# Patient Record
Sex: Female | Born: 1960 | Race: White | Hispanic: No | Marital: Married | Smoking: Former smoker
Health system: Southern US, Community
[De-identification: ages and names within clinical notes are randomized; demographics above are authoritative.]

## PROBLEM LIST (undated history)

## (undated) DIAGNOSIS — R7989 Other specified abnormal findings of blood chemistry: Secondary | ICD-10-CM

## (undated) DIAGNOSIS — E785 Hyperlipidemia, unspecified: Secondary | ICD-10-CM

## (undated) DIAGNOSIS — Z9289 Personal history of other medical treatment: Secondary | ICD-10-CM

## (undated) DIAGNOSIS — M1990 Unspecified osteoarthritis, unspecified site: Secondary | ICD-10-CM

## (undated) DIAGNOSIS — D72819 Decreased white blood cell count, unspecified: Secondary | ICD-10-CM

## (undated) DIAGNOSIS — E559 Vitamin D deficiency, unspecified: Secondary | ICD-10-CM

## (undated) DIAGNOSIS — Z87442 Personal history of urinary calculi: Secondary | ICD-10-CM

## (undated) DIAGNOSIS — K5790 Diverticulosis of intestine, part unspecified, without perforation or abscess without bleeding: Secondary | ICD-10-CM

## (undated) DIAGNOSIS — M5030 Other cervical disc degeneration, unspecified cervical region: Secondary | ICD-10-CM

## (undated) DIAGNOSIS — F32A Depression, unspecified: Secondary | ICD-10-CM

## (undated) DIAGNOSIS — G35 Multiple sclerosis: Secondary | ICD-10-CM

## (undated) HISTORY — DX: Personal history of other medical treatment: Z92.89

## (undated) HISTORY — DX: Other specified abnormal findings of blood chemistry: R79.89

## (undated) HISTORY — PX: CHOLECYSTECTOMY: SHX55

## (undated) HISTORY — PX: COLONOSCOPY: SHX174

## (undated) HISTORY — DX: Multiple sclerosis: G35

## (undated) HISTORY — DX: Unspecified osteoarthritis, unspecified site: M19.90

## (undated) HISTORY — PX: APPENDECTOMY: SHX54

## (undated) HISTORY — DX: Depression, unspecified: F32.A

## (undated) HISTORY — DX: Hyperlipidemia, unspecified: E78.5

---

## 2018-11-22 ENCOUNTER — Other Ambulatory Visit: Payer: Self-pay | Admitting: Neurology

## 2018-11-22 DIAGNOSIS — G35 Multiple sclerosis: Secondary | ICD-10-CM

## 2018-12-03 ENCOUNTER — Encounter: Payer: Self-pay | Admitting: Radiology

## 2018-12-03 ENCOUNTER — Ambulatory Visit
Admission: RE | Admit: 2018-12-03 | Discharge: 2018-12-03 | Disposition: A | Discharge status: Home / Self Care | Payer: Medicare HMO | Source: Ambulatory Visit | Attending: Neurology | Admitting: Neurology

## 2018-12-03 DIAGNOSIS — G35 Multiple sclerosis: Secondary | ICD-10-CM

## 2018-12-03 MED ORDER — GADOBUTROL 1 MMOL/ML IV SOLN
9.0000 mL | Freq: Once | INTRAVENOUS | Status: AC | PRN
  Administered 2018-12-03: 9 mL via INTRAVENOUS

## 2019-03-28 ENCOUNTER — Other Ambulatory Visit: Payer: Self-pay | Admitting: Neurology

## 2019-03-28 DIAGNOSIS — G35 Multiple sclerosis: Secondary | ICD-10-CM

## 2019-04-07 ENCOUNTER — Ambulatory Visit
Admission: RE | Admit: 2019-04-07 | Discharge: 2019-04-07 | Disposition: A | Discharge status: Home / Self Care | Payer: Medicare HMO | Source: Ambulatory Visit | Attending: Neurology | Admitting: Neurology

## 2019-04-07 ENCOUNTER — Other Ambulatory Visit: Payer: Self-pay

## 2019-04-07 DIAGNOSIS — G35 Multiple sclerosis: Secondary | ICD-10-CM | POA: Diagnosis not present

## 2019-04-07 MED ORDER — GADOBUTROL 1 MMOL/ML IV SOLN
10.0000 mL | Freq: Once | INTRAVENOUS | Status: AC | PRN
  Administered 2019-04-07: 10 mL via INTRAVENOUS

## 2019-04-20 HISTORY — PX: COLONOSCOPY: SHX174

## 2019-09-06 ENCOUNTER — Ambulatory Visit: Payer: Medicare HMO | Admitting: Physical Therapy

## 2019-10-09 ENCOUNTER — Other Ambulatory Visit: Payer: Self-pay | Admitting: Neurology

## 2019-10-09 DIAGNOSIS — G35 Multiple sclerosis: Secondary | ICD-10-CM

## 2019-11-15 ENCOUNTER — Other Ambulatory Visit: Payer: Self-pay

## 2019-11-15 ENCOUNTER — Encounter: Payer: Self-pay | Admitting: Physical Therapy

## 2019-11-15 ENCOUNTER — Ambulatory Visit: Payer: Medicare HMO | Attending: Neurology | Admitting: Physical Therapy

## 2019-11-15 DIAGNOSIS — M6281 Muscle weakness (generalized): Secondary | ICD-10-CM | POA: Diagnosis present

## 2019-11-15 DIAGNOSIS — M25512 Pain in left shoulder: Secondary | ICD-10-CM | POA: Diagnosis present

## 2019-11-15 DIAGNOSIS — M25561 Pain in right knee: Secondary | ICD-10-CM | POA: Insufficient documentation

## 2019-11-15 DIAGNOSIS — R269 Unspecified abnormalities of gait and mobility: Secondary | ICD-10-CM | POA: Diagnosis present

## 2019-11-15 DIAGNOSIS — G35 Multiple sclerosis: Secondary | ICD-10-CM

## 2019-11-15 DIAGNOSIS — G8929 Other chronic pain: Secondary | ICD-10-CM | POA: Diagnosis present

## 2019-11-15 NOTE — Therapy (Signed)
[facility] [address]. [place], [place], [address] Phone: [phone]   Fax:  [phone]  Physical Therapy Evaluation  Patient Details  Name: Stefanie Cuevas MRN: [id number] Date of Birth: 07-01-1961 No data recorded  Encounter Date: 11/15/2019  PT End of Session - 11/16/19 1721    Visit Number  1    Number of Visits  1    Date for PT Re-Evaluation  11/16/19    PT Start Time  0802    PT Stop Time  0947    PT Time Calculation (min)  105 min    Activity Tolerance  Patient tolerated treatment well;Patient limited by fatigue       History reviewed. No pertinent past medical history.  History reviewed. No pertinent surgical history.  There were no vitals filed for this visit.   Subjective Assessment - 11/16/19 1720    Subjective  See FCE report    Limitations  Standing;Walking;House hold activities    Patient Stated Goals  FCE only    Currently in Pain?  Yes    Pain Score  3     Pain Location  Shoulder    Pain Orientation  Left;Anterior    Pain Descriptors / Indicators  Tingling;Numbness    Pain Type  Chronic pain    Multiple Pain Sites  Yes    Pain Score  3    Pain Location  Knee    Pain Orientation  Right    Pain Descriptors / Indicators  Aching    Pain Type  Chronic pain                 Plan - 11/16/19 1722    Clinical Impression Statement  Overall Level of Work: Falls within the Light range.  Exerting up to 20 pounds of force occasionally, and/or up to 10 pounds of force frequently, and/or a negligible amount of force constantly (Constantly: activity or condition exist 2/3 or more of the time) to move objects.  Physical demand requirements are in excess of those for Sedentary Work.  Even though the weight lifted may be only a negligible amount, a job should be rated Light Work: (1) when it requires walking or standing to significant degree; or (2) when it requires sitting most of the time but entails pushing  and/or pulling of arm or leg controls; and/or (3) when the job requires working at a production rate pace entailing the constant pushing and/or pulling of materials even though the weight of those materials is negligible.  NOTE: The constant stress and strain of maintaining a production rate pace, especially in an industrial setting, can be and is physically demanding of a worker even though the amount of force exerted is negligible.    Please see the Task Performance Table for specific abilities.  Tolerance for the 8-Hour Day: Based on the individual task scores in Dynamic Strength, Position Tolerance and Mobility, the client is able to tolerate the Light level of work for the 8-hour day/40-hour week.    Stability/Clinical Decision Making  Evolving/Moderate complexity    Clinical Decision Making  Moderate    Rehab Potential  Fair    PT Frequency  One time visit    PT Treatment/Interventions  Functional mobility training;Therapeutic exercise;Balance training;Neuromuscular re-education;Therapeutic activities;Gait training;Stair training;ADLs/Self Care Home Management    PT Next Visit Plan  FCE only       Patient will benefit from skilled therapeutic intervention in order to improve the following deficits  and impairments:  Abnormal gait, Decreased balance, Decreased mobility, Difficulty walking, Decreased range of motion, Decreased activity tolerance, Decreased strength, Impaired flexibility, Impaired UE functional use, Pain  Visit Diagnosis: Multiple sclerosis (HCC)  Muscle weakness (generalized)  Gait difficulty  Chronic left shoulder pain  Chronic pain of right knee     Problem List There are no problems to display for this patient.  Pura Spice, PT, DPT # [phone] 11/16/2019, 5:41 PM  [facility] [address] [place], [place], [address] Phone: [phone]   Fax:  [phone]  Name: Stefanie Cuevas MRN: [id number] Date of Birth:  1961-09-14

## 2019-11-16 ENCOUNTER — Encounter: Payer: Self-pay | Admitting: Physical Therapy

## 2019-12-19 ENCOUNTER — Ambulatory Visit
Admission: RE | Admit: 2019-12-19 | Discharge: 2019-12-19 | Disposition: A | Discharge status: Home / Self Care | Payer: Medicare HMO | Source: Ambulatory Visit | Attending: Neurology | Admitting: Neurology

## 2019-12-19 ENCOUNTER — Other Ambulatory Visit: Payer: Self-pay

## 2019-12-19 DIAGNOSIS — G35 Multiple sclerosis: Secondary | ICD-10-CM | POA: Diagnosis not present

## 2019-12-19 MED ORDER — GADOBUTROL 1 MMOL/ML IV SOLN
10.0000 mL | Freq: Once | INTRAVENOUS | Status: AC | PRN
  Administered 2019-12-19: 11:00:00 10 mL via INTRAVENOUS

## 2020-01-06 ENCOUNTER — Other Ambulatory Visit (HOSPITAL_COMMUNITY): Payer: Self-pay | Admitting: Internal Medicine

## 2020-01-06 DIAGNOSIS — Z1231 Encounter for screening mammogram for malignant neoplasm of breast: Secondary | ICD-10-CM

## 2020-01-08 ENCOUNTER — Other Ambulatory Visit: Payer: Self-pay

## 2020-01-08 ENCOUNTER — Ambulatory Visit (HOSPITAL_COMMUNITY)
Admission: RE | Admit: 2020-01-08 | Discharge: 2020-01-08 | Disposition: A | Discharge status: Home / Self Care | Payer: Medicare HMO | Source: Ambulatory Visit | Attending: Internal Medicine | Admitting: Internal Medicine

## 2020-01-08 DIAGNOSIS — Z1231 Encounter for screening mammogram for malignant neoplasm of breast: Secondary | ICD-10-CM | POA: Insufficient documentation

## 2020-12-09 ENCOUNTER — Other Ambulatory Visit (HOSPITAL_COMMUNITY): Payer: Self-pay | Admitting: Internal Medicine

## 2020-12-09 DIAGNOSIS — Z1231 Encounter for screening mammogram for malignant neoplasm of breast: Secondary | ICD-10-CM

## 2021-01-11 ENCOUNTER — Ambulatory Visit (HOSPITAL_COMMUNITY)
Admission: RE | Admit: 2021-01-11 | Discharge: 2021-01-11 | Disposition: A | Discharge status: Home / Self Care | Payer: Medicare HMO | Source: Ambulatory Visit | Attending: Internal Medicine | Admitting: Internal Medicine

## 2021-01-11 DIAGNOSIS — Z1231 Encounter for screening mammogram for malignant neoplasm of breast: Secondary | ICD-10-CM | POA: Diagnosis not present

## 2021-04-05 HISTORY — PX: OTHER SURGICAL HISTORY: SHX169

## 2021-07-05 ENCOUNTER — Other Ambulatory Visit (HOSPITAL_COMMUNITY): Payer: Self-pay | Admitting: Internal Medicine

## 2021-07-05 ENCOUNTER — Other Ambulatory Visit: Payer: Self-pay | Admitting: Internal Medicine

## 2021-07-05 DIAGNOSIS — M25561 Pain in right knee: Secondary | ICD-10-CM

## 2021-07-07 ENCOUNTER — Other Ambulatory Visit: Payer: Self-pay

## 2021-07-07 ENCOUNTER — Ambulatory Visit (HOSPITAL_COMMUNITY)
Admission: RE | Admit: 2021-07-07 | Discharge: 2021-07-07 | Disposition: A | Discharge status: Home / Self Care | Payer: Medicare HMO | Source: Ambulatory Visit | Attending: Internal Medicine | Admitting: Internal Medicine

## 2021-07-07 DIAGNOSIS — M25561 Pain in right knee: Secondary | ICD-10-CM | POA: Diagnosis not present

## 2021-11-29 DIAGNOSIS — R569 Unspecified convulsions: Secondary | ICD-10-CM

## 2021-11-29 HISTORY — DX: Unspecified convulsions: R56.9

## 2021-12-09 ENCOUNTER — Other Ambulatory Visit: Payer: Self-pay | Admitting: Neurology

## 2021-12-09 DIAGNOSIS — G35 Multiple sclerosis: Secondary | ICD-10-CM

## 2021-12-20 ENCOUNTER — Ambulatory Visit
Admission: RE | Admit: 2021-12-20 | Discharge: 2021-12-20 | Disposition: A | Discharge status: Home / Self Care | Payer: Medicare HMO | Source: Ambulatory Visit | Attending: Neurology | Admitting: Neurology

## 2021-12-20 ENCOUNTER — Other Ambulatory Visit: Payer: Self-pay

## 2021-12-20 DIAGNOSIS — G35 Multiple sclerosis: Secondary | ICD-10-CM | POA: Diagnosis present

## 2021-12-20 MED ORDER — GADOBUTROL 1 MMOL/ML IV SOLN
6.0000 mL | Freq: Once | INTRAVENOUS | Status: AC | PRN
  Administered 2021-12-20: 6 mL via INTRAVENOUS

## 2022-03-02 ENCOUNTER — Other Ambulatory Visit (HOSPITAL_COMMUNITY): Payer: Self-pay | Admitting: Internal Medicine

## 2022-03-02 DIAGNOSIS — Z1231 Encounter for screening mammogram for malignant neoplasm of breast: Secondary | ICD-10-CM

## 2022-03-15 ENCOUNTER — Other Ambulatory Visit (HOSPITAL_COMMUNITY): Payer: Self-pay | Admitting: Internal Medicine

## 2022-03-15 ENCOUNTER — Other Ambulatory Visit: Payer: Self-pay | Admitting: Internal Medicine

## 2022-03-15 DIAGNOSIS — R7989 Other specified abnormal findings of blood chemistry: Secondary | ICD-10-CM

## 2022-03-16 ENCOUNTER — Ambulatory Visit (HOSPITAL_COMMUNITY)
Admission: RE | Admit: 2022-03-16 | Discharge: 2022-03-16 | Disposition: A | Discharge status: Home / Self Care | Payer: Medicare HMO | Source: Ambulatory Visit | Attending: Internal Medicine | Admitting: Internal Medicine

## 2022-03-16 DIAGNOSIS — Z1231 Encounter for screening mammogram for malignant neoplasm of breast: Secondary | ICD-10-CM | POA: Diagnosis present

## 2022-03-16 DIAGNOSIS — R921 Mammographic calcification found on diagnostic imaging of breast: Secondary | ICD-10-CM | POA: Insufficient documentation

## 2022-03-18 ENCOUNTER — Other Ambulatory Visit (HOSPITAL_COMMUNITY): Payer: Self-pay | Admitting: Internal Medicine

## 2022-03-22 ENCOUNTER — Other Ambulatory Visit (HOSPITAL_COMMUNITY): Payer: Self-pay | Admitting: Internal Medicine

## 2022-03-22 DIAGNOSIS — R928 Other abnormal and inconclusive findings on diagnostic imaging of breast: Secondary | ICD-10-CM

## 2022-03-28 ENCOUNTER — Ambulatory Visit (HOSPITAL_COMMUNITY)
Admission: RE | Admit: 2022-03-28 | Discharge: 2022-03-28 | Disposition: A | Discharge status: Home / Self Care | Payer: Medicare HMO | Source: Ambulatory Visit | Attending: Internal Medicine | Admitting: Internal Medicine

## 2022-03-28 DIAGNOSIS — R7989 Other specified abnormal findings of blood chemistry: Secondary | ICD-10-CM | POA: Diagnosis present

## 2022-03-30 ENCOUNTER — Ambulatory Visit (HOSPITAL_COMMUNITY)
Admission: RE | Admit: 2022-03-30 | Discharge: 2022-03-30 | Disposition: A | Discharge status: Home / Self Care | Payer: Medicare HMO | Source: Ambulatory Visit | Attending: Internal Medicine | Admitting: Internal Medicine

## 2022-03-30 ENCOUNTER — Encounter (HOSPITAL_COMMUNITY): Payer: Self-pay

## 2022-03-30 DIAGNOSIS — R928 Other abnormal and inconclusive findings on diagnostic imaging of breast: Secondary | ICD-10-CM

## 2022-05-26 ENCOUNTER — Other Ambulatory Visit: Payer: Self-pay | Admitting: Student

## 2022-05-26 DIAGNOSIS — R202 Paresthesia of skin: Secondary | ICD-10-CM

## 2022-05-27 ENCOUNTER — Other Ambulatory Visit (HOSPITAL_COMMUNITY): Payer: Self-pay | Admitting: Student

## 2022-05-27 DIAGNOSIS — R202 Paresthesia of skin: Secondary | ICD-10-CM

## 2022-06-10 ENCOUNTER — Ambulatory Visit (HOSPITAL_COMMUNITY)
Admission: RE | Admit: 2022-06-10 | Discharge: 2022-06-10 | Disposition: A | Discharge status: Home / Self Care | Payer: Medicare HMO | Source: Ambulatory Visit | Attending: Student | Admitting: Student

## 2022-06-10 DIAGNOSIS — R202 Paresthesia of skin: Secondary | ICD-10-CM | POA: Diagnosis present

## 2022-06-10 MED ORDER — GADOBUTROL 1 MMOL/ML IV SOLN
6.5000 mL | Freq: Once | INTRAVENOUS | Status: AC | PRN
  Administered 2022-06-10: 6.5 mL via INTRAVENOUS

## 2022-09-07 ENCOUNTER — Other Ambulatory Visit: Payer: Self-pay | Admitting: Internal Medicine

## 2022-09-07 DIAGNOSIS — R921 Mammographic calcification found on diagnostic imaging of breast: Secondary | ICD-10-CM

## 2022-09-21 NOTE — Progress Notes (Unsigned)
Referring Physician:  Lonell Face, MD [phone] [facility] [address] [facility] [place],  [place] [address]  Primary Physician:  Gracelyn Nurse, MD  History of Present Illness: 09/22/2022 [name] is here today with a chief complaint of neck pain that radiates into the bilateral shoulder pain with radiation down into the biceps.  She has been having symptoms for the past 3 years.  She reports aching and severe pain as bad as 10 out of 10.  Lifting and straining her arm makes it worse.  Her pain is primarily in her shoulders.  It stops in her mid upper arm.  She has no pain in her forearms or hands.  She has no numbness or tingling, though she has multiple sclerosis.  She has significant pain in her left shoulder blade currently.  Demonstrating her arm makes it worse.  Heat has made it better.   Bowel/Bladder Dysfunction: none  Conservative measures:  Physical therapy: hasn't participated in recently Multimodal medical therapy including regular antiinflammatories: gabapentin, prednisone, cymbalta, baclofen  Injections:  has received epidural steroid injections 09/20/2022: Left C5-6 transforaminal ESI 08/09/2022: Left subacromial injection (mild relief) 07/19/2022: Left C4-5 transforaminal ESI (1 day of relief) 05/07/2020: Right C4-5 transforaminal ESI (complete relief) 04/02/2020: Right C5-6 transforaminal ESI (no relief)   Past Surgery: denies  Stefanie Cuevas has no symptoms of cervical myelopathy.  The symptoms are causing a significant impact on the patient's life.   I have utilized the care everywhere function in epic to review the outside records available from external health systems.  Review of Systems:  A 10 point review of systems is negative, except for the pertinent positives and negatives detailed in the HPI.  Past Medical History: No past medical history on file.  Past Surgical History: No past surgical history on  file.  Allergies: Allergies as of 09/22/2022   (No Known Allergies)    Medications: Current Meds  Medication Sig   baclofen (LIORESAL) 10 MG tablet Take 10 mg by mouth 4 (four) times daily.   DULoxetine (CYMBALTA) 20 MG capsule Take 20 mg by mouth daily.   gabapentin (NEURONTIN) 100 MG capsule Take 2 capsules by mouth 3 (three) times daily.   lamoTRIgine (LAMICTAL) 25 MG tablet Take 25 mg by mouth 2 (two) times daily.   Ofatumumab (KESIMPTA) 20 MG/0.4ML SOAJ Inject into the skin.    Social History: Social History   Tobacco Use   Smoking status: Never   Smokeless tobacco: Never    Family Medical History: No family history on file.  Physical Examination: Vitals:   09/22/22 1304  BP: 126/80    General: Patient is well developed, well nourished, calm, collected, and in no apparent distress. Attention to examination is appropriate.  Neck:   Supple.  Full range of motion.  Respiratory: Patient is breathing without any difficulty.   NEUROLOGICAL:     Awake, alert, oriented to person, place, and time.  Speech is clear and fluent. Fund of knowledge is appropriate.   Cranial Nerves: Pupils equal round and reactive to light.  Facial tone is symmetric.  Facial sensation is symmetric. Shoulder shrug is symmetric. Tongue protrusion is midline.  There is no pronator drift.  ROM of spine: full.    Strength: Side Biceps Triceps Deltoid Interossei Grip Wrist Ext. Wrist Flex.  R 5 5 5 5 5 5 5   L 5 5 4+ 4- 3 4+ 4+   Side Iliopsoas Quads Hamstring PF DF EHL  R 5  5 5 5 5 5   L 5 5 5 5 5 5    Reflexes are 2+ and symmetric at the biceps, triceps, brachioradialis, patella and achilles.   Hoffman's is absent.   Bilateral upper and lower extremity sensation is intact to light touch.    No evidence of dysmetria noted.  Gait is abnormal and requires a cane.  She has tenderness to palpation in both of her shoulders near her biceps tendon.  She has discomfort with range of motion of  her upper extremities.     Medical Decision Making  Imaging: MRI C spine 06/10/2022 IMPRESSION: 1. At C4-5 there is a broad-based disc bulge. Severe left facet arthropathy. Severe left foraminal stenosis. Moderate right foraminal stenosis. Mild spinal stenosis. 2. At C5-6 there is a broad-based disc bulge with a small left paracentral shallow disc protrusion. Severe right foraminal stenosis. Mild left foraminal stenosis. 3. At C6-7 there is a mild broad-based disc bulge. Bilateral uncovertebral degenerative changes. Moderate right and moderate-severe left foraminal stenosis.     Electronically Signed   By: M.D.   On: 06/13/2022 08:56  I have personally reviewed the images and agree with the above interpretation.  Assessment and Plan: [name] is a pleasant 61 y.o. 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  I am somewhat concerned that she has some primary shoulder pathology.  I would like to send her for orthopedic evaluation for this.  I will start her on physical therapy for her radiculopathy and shoulder pain.  I will see her back in 6 to 8 weeks for reevaluation.  I spent a total of 30 minutes in this patient's care today. This time was spent reviewing pertinent records including imaging studies, obtaining and confirming history, performing a directed evaluation, formulating and discussing my recommendations, and documenting the visit within the medical record.   Thank you for involving me in the care of this patient.      Shawnay Bramel K. Virginia Rochester MD, [facility] Neurosurgery'

## 2022-09-22 ENCOUNTER — Encounter: Payer: Self-pay | Admitting: Neurosurgery

## 2022-09-22 ENCOUNTER — Ambulatory Visit: Payer: Medicare HMO | Admitting: Neurosurgery

## 2022-09-22 VITALS — BP 126/80 | Ht 62.0 in | Wt 154.2 lb

## 2022-09-22 DIAGNOSIS — M4312 Spondylolisthesis, cervical region: Secondary | ICD-10-CM

## 2022-09-22 DIAGNOSIS — M542 Cervicalgia: Secondary | ICD-10-CM | POA: Diagnosis not present

## 2022-09-22 DIAGNOSIS — M25511 Pain in right shoulder: Secondary | ICD-10-CM

## 2022-09-22 DIAGNOSIS — G8929 Other chronic pain: Secondary | ICD-10-CM

## 2022-09-22 DIAGNOSIS — M5412 Radiculopathy, cervical region: Secondary | ICD-10-CM

## 2022-09-22 DIAGNOSIS — M25512 Pain in left shoulder: Secondary | ICD-10-CM

## 2022-10-13 ENCOUNTER — Ambulatory Visit
Admission: RE | Admit: 2022-10-13 | Discharge: 2022-10-13 | Disposition: A | Discharge status: Home / Self Care | Payer: Medicare HMO | Source: Ambulatory Visit | Attending: Internal Medicine | Admitting: Internal Medicine

## 2022-10-13 DIAGNOSIS — R921 Mammographic calcification found on diagnostic imaging of breast: Secondary | ICD-10-CM | POA: Insufficient documentation

## 2022-10-19 ENCOUNTER — Other Ambulatory Visit: Payer: Self-pay | Admitting: Internal Medicine

## 2022-10-19 DIAGNOSIS — R921 Mammographic calcification found on diagnostic imaging of breast: Secondary | ICD-10-CM

## 2022-10-19 DIAGNOSIS — R928 Other abnormal and inconclusive findings on diagnostic imaging of breast: Secondary | ICD-10-CM

## 2022-10-25 ENCOUNTER — Ambulatory Visit
Admission: RE | Admit: 2022-10-25 | Discharge: 2022-10-25 | Disposition: A | Discharge status: Home / Self Care | Payer: Medicare HMO | Source: Ambulatory Visit | Attending: Internal Medicine | Admitting: Internal Medicine

## 2022-10-25 DIAGNOSIS — R928 Other abnormal and inconclusive findings on diagnostic imaging of breast: Secondary | ICD-10-CM | POA: Insufficient documentation

## 2022-10-25 DIAGNOSIS — R921 Mammographic calcification found on diagnostic imaging of breast: Secondary | ICD-10-CM | POA: Diagnosis present

## 2022-10-25 HISTORY — PX: BREAST BIOPSY: SHX20

## 2022-10-25 MED ORDER — LIDOCAINE HCL (PF) 2 % IJ SOLN
5.0000 mL | Freq: Once | INTRAMUSCULAR | Status: AC
  Administered 2022-10-25: 5 mL

## 2022-10-25 MED ORDER — LIDOCAINE-EPINEPHRINE 1 %-1:100000 IJ SOLN
10.0000 mL | Freq: Once | INTRAMUSCULAR | Status: AC
  Administered 2022-10-25: 10 mL

## 2022-10-26 LAB — SURGICAL PATHOLOGY

## 2022-11-22 ENCOUNTER — Ambulatory Visit: Payer: Medicare HMO | Admitting: Neurosurgery

## 2022-12-13 ENCOUNTER — Ambulatory Visit: Payer: Medicare HMO | Admitting: Neurosurgery

## 2023-01-04 ENCOUNTER — Other Ambulatory Visit: Payer: Self-pay | Admitting: Surgery

## 2023-01-04 DIAGNOSIS — M7582 Other shoulder lesions, left shoulder: Secondary | ICD-10-CM

## 2023-01-05 ENCOUNTER — Ambulatory Visit: Payer: Medicare HMO | Admitting: Neurosurgery

## 2023-01-05 ENCOUNTER — Encounter: Payer: Self-pay | Admitting: Neurosurgery

## 2023-01-05 VITALS — BP 90/60 | HR 68 | Ht 62.0 in | Wt 154.0 lb

## 2023-01-05 DIAGNOSIS — M5412 Radiculopathy, cervical region: Secondary | ICD-10-CM

## 2023-01-05 NOTE — H&P (View-Only) (Signed)
  Referring Physician:  Johnston, John D, MD [address] [place],  [place] [address]  Primary Physician:  Johnston, John D, MD  History of Present Illness: 01/05/2023 Stefanie Cuevas returns with continued symptoms despite physical therapy.  She continues to have left shoulder blade, shoulder, and arm pain.  She has been evaluated by Dr. Poage at [facility].  She has an MRI scheduled next week.   09/22/2022 [name] is here today with a chief complaint of neck pain that radiates into the bilateral shoulder pain with radiation down into the biceps.  She has been having symptoms for the past 3 years.  She reports aching and severe pain as bad as 10 out of 10.  Lifting and straining her arm makes it worse.  Her pain is primarily in her shoulders.  It stops in her mid upper arm.  She has no pain in her forearms or hands.  She has no numbness or tingling, though she has multiple sclerosis.  She has significant pain in her left shoulder blade currently.  Demonstrating her arm makes it worse.  Heat has made it better.   Bowel/Bladder Dysfunction: none  Conservative measures:  Physical therapy: hasn't participated in recently Multimodal medical therapy including regular antiinflammatories: gabapentin, prednisone, cymbalta, baclofen  Injections:  has received epidural steroid injections 09/20/2022: Left C5-6 transforaminal ESI 08/09/2022: Left subacromial injection (mild relief) 07/19/2022: Left C4-5 transforaminal ESI (1 day of relief) 05/07/2020: Right C4-5 transforaminal ESI (complete relief) 04/02/2020: Right C5-6 transforaminal ESI (no relief)   Past Surgery: denies  Emmogene Samuelson has no symptoms of cervical myelopathy.  The symptoms are causing a significant impact on the patient's life.   I have utilized the care everywhere function in epic to review the outside records available from external health systems.  Review of Systems:  A 10 point review of systems is negative,  except for the pertinent positives and negatives detailed in the HPI.  Past Medical History: No past medical history on file.  Past Surgical History: Past Surgical History:  Procedure Laterality Date   BREAST BIOPSY Left 10/25/2022   Stereo Bx, X clip, path pending   BREAST BIOPSY Left 10/25/2022   MM LT BREAST BX W LOC DEV 1ST LESION IMAGE BX SPEC STEREO GUIDE 10/25/2022 [facility]    Allergies: Allergies as of 01/05/2023   (No Known Allergies)    Medications: Current Meds  Medication Sig   baclofen (LIORESAL) 10 MG tablet Take 10 mg by mouth 4 (four) times daily.   gabapentin (NEURONTIN) 100 MG capsule Take 2 capsules by mouth 3 (three) times daily.   lamoTRIgine (LAMICTAL) 25 MG tablet Take 25 mg by mouth 2 (two) times daily.   Ofatumumab (KESIMPTA) 20 MG/0.4ML SOAJ Inject into the skin.    Social History: Social History   Tobacco Use   Smoking status: Never   Smokeless tobacco: Never    Family Medical History: No family history on file.  Physical Examination: Vitals:   01/05/23 1130  BP: 90/60  Pulse: 68  SpO2: 98%    General: Patient is well developed, well nourished, calm, collected, and in no apparent distress. Attention to examination is appropriate.  Neck:   Supple.  Full range of motion.  Respiratory: Patient is breathing without any difficulty.   NEUROLOGICAL:     Awake, alert, oriented to person, place, and time.  Speech is clear and fluent. Fund of knowledge is appropriate.   Cranial Nerves: Pupils equal round and reactive to   light.  Facial tone is symmetric.  Facial sensation is symmetric. Shoulder shrug is symmetric. Tongue protrusion is midline.  There is no pronator drift.  ROM of spine: full.    Strength: Side Biceps Triceps Deltoid Interossei Grip Wrist Ext. Wrist Flex.  R 5 5 5 5 5 5 5  L 5 5 4+ 4- 3 4+ 4+   Side Iliopsoas Quads Hamstring PF DF EHL  R 5 5 5 5 5 5  L 5 5 5 5 5 5   Reflexes are 2+ and symmetric at the biceps,  triceps, brachioradialis, patella and achilles.   Hoffman's is absent.   Bilateral upper and lower extremity sensation is intact to light touch.    No evidence of dysmetria noted.  Gait is abnormal and requires a cane.  She has tenderness to palpation in both of her shoulders near her biceps tendon.  She has discomfort with range of motion of her upper extremities.     Medical Decision Making  Imaging: MRI C spine 06/10/2022 IMPRESSION: 1. At C4-5 there is a broad-based disc bulge. Severe left facet arthropathy. Severe left foraminal stenosis. Moderate right foraminal stenosis. Mild spinal stenosis. 2. At C5-6 there is a broad-based disc bulge with a small left paracentral shallow disc protrusion. Severe right foraminal stenosis. Mild left foraminal stenosis. 3. At C6-7 there is a mild broad-based disc bulge. Bilateral uncovertebral degenerative changes. Moderate right and moderate-severe left foraminal stenosis.     Electronically Signed   By: Hetal  Patel M.D.   On: 06/13/2022 08:56  I have personally reviewed the images and agree with the above interpretation.  Assessment and Plan: Stefanie Cuevas is a pleasant 62 y.o. 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  We discussed that she may have primary shoulder pathology.  Will see what her MRI shows.  Additionally, she has multiple sclerosis which may be clouding her clinical picture.  She has only left-sided symptoms, and has left C6-7 foraminal stenosis.  It is very possible that the substantial portion of her symptoms are due to left C7 radiculopathy.  She has tried and failed physical therapy.  At this point, C6-7 anterior cervical discectomy and fusion could be considered.  Will go ahead and schedule this, but we will wait to see what her MRI of her shoulder shows before making a final plan as to whether cervical spine or shoulder  intervention should be considered first.  I have described to her the diagnostic uncertainty associated with her presentation.  She is understanding of this dilemma.  That being said, she has met indications for cervical surgery.  No further conservative management is indicated at this time.  We discussed C6-7 anterior cervical discectomy and fusion.   I discussed the planned procedure at length with the patient, including the risks, benefits, alternatives, and indications. The risks discussed include but are not limited to bleeding, infection, need for reoperation, spinal fluid leak, stroke, vision loss, anesthetic complication, coma, paralysis, and even death. We also discussed the possibility of post-operative dysphagia, vocal cord paralysis, and the risk of adjacent segment disease in the future. I also described in detail that improvement was not guaranteed.  The patient expressed understanding of these risks, and asked that we proceed with surgery. I described the surgery in layman's terms, and gave ample opportunity for questions, which were answered to the best of my ability.  I spent a total of 10 minutes in   this patient's care today. This time was spent reviewing pertinent records including imaging studies, obtaining and confirming history, performing a directed evaluation, formulating and discussing my recommendations, and documenting the visit within the medical record.   Thank you for involving me in the care of this patient.      Adonys Wildes K. Briell Paulette MD, MPHS Neurosurgery' 

## 2023-01-05 NOTE — Patient Instructions (Addendum)
Please see below for information in regards to your upcoming surgery:   Planned surgery: C6-7 anterior cervical discectomy and fusion   Surgery date: 02/01/23 - you will find out your arrival time the business day before your surgery.   Pre-op appointment at [facility]: we will call you with a date/time for this. Pre-admit testing is located on the first floor of the [facility], [place], [address]. Please bring all prescriptions in the original prescription bottles to your appointment, even if you have reviewed medications by phone with a pharmacy representative. Pre-op labs may be done at your pre-op appointment. You are not required to fast for these labs. Should you need to change your pre-op appointment, please call Pre-admit testing at [phone].    Kesimpta: hold 2 doses    NSAIDS (Non-steroidal anti-inflammatory drugs): because you are having a fusion, no NSAIDS (such as ibuprofen, aleve, naproxen, meloxicam, diclofenac) for 3 months after surgery. Celebrex is an exception. Tylenol is ok because it is not an NSAID.    Because you are having a fusion: for appointments after your 2 week follow-up: please arrive at the [facility] outpatient imaging center ([place], [place]) or [facility] one hour prior to your appointment for x-rays. This applies to every appointment after your 2 week follow-up. Failure to do so may result in your appointment being rescheduled.    We can be reached by phone or mychart 8am-4pm, Monday-Friday. If you have any questions/concerns before or after surgery, you can reach [place] at [phone], or you can send a mychart message. If you have a concern after hours that cannot wait until normal business hours, you can call [phone] and ask to page the neurosurgeon on call for [place].   Appointments/FMLA & disability paperwork: [place]  Nurse: Ophelia Shoulder   Medical assistants: Lum Keas Physician Assistant's: Ali Chukson Surgeon: Meade Maw, MD

## 2023-01-05 NOTE — Progress Notes (Signed)
Referring Physician:  Baxter Hire, MD [place],   [id number]  Primary Physician:  Stefanie Hire, MD  History of Present Illness: 01/05/2023 [name] returns with continued symptoms despite physical therapy.  She continues to have left shoulder blade, shoulder, and arm pain.  She has been evaluated by Dr. Cy Cuevas at [facility].  She has an MRI scheduled next week.   09/22/2022 [name] is here today with a chief complaint of neck pain that radiates into the bilateral shoulder pain with radiation down into the biceps.  She has been having symptoms for the past 3 years.  She reports aching and severe pain as bad as 10 out of 10.  Lifting and straining her arm makes it worse.  Her pain is primarily in her shoulders.  It stops in her mid upper arm.  She has no pain in her forearms or hands.  She has no numbness or tingling, though she has multiple sclerosis.  She has significant pain in her left shoulder blade currently.  Demonstrating her arm makes it worse.  Heat has made it better.   Bowel/Bladder Dysfunction: none  Conservative measures:  Physical therapy: hasn't participated in recently Multimodal medical therapy including regular antiinflammatories: gabapentin, prednisone, cymbalta, baclofen  Injections:  has received epidural steroid injections 09/20/2022: Left C5-6 transforaminal ESI 08/09/2022: Left subacromial injection (mild relief) 07/19/2022: Left C4-5 transforaminal ESI (1 day of relief) 05/07/2020: Right C4-5 transforaminal ESI (complete relief) 04/02/2020: Right C5-6 transforaminal ESI (no relief)   Past Surgery: denies  Stefanie Cuevas has no symptoms of cervical myelopathy.  The symptoms are causing a significant impact on the patient's life.   I have utilized the care everywhere function in epic to review the outside records available from external health systems.  Review of Systems:  A 10 point review of systems is negative,  except for the pertinent positives and negatives detailed in the HPI.  Past Medical History: No past medical history on file.  Past Surgical History: Past Surgical History:  Procedure Laterality Date   BREAST BIOPSY Left 10/25/2022   Stereo Bx, X clip, path pending   BREAST BIOPSY Left 10/25/2022   MM LT BREAST BX W LOC DEV 1ST LESION IMAGE BX SPEC STEREO GUIDE 10/25/2022 [facility]    Allergies: Allergies as of 01/05/2023   (No Known Allergies)    Medications: Current Meds  Medication Sig   baclofen (LIORESAL) 10 MG tablet Take 10 mg by mouth 4 (four) times daily.   gabapentin (NEURONTIN) 100 MG capsule Take 2 capsules by mouth 3 (three) times daily.   lamoTRIgine (LAMICTAL) 25 MG tablet Take 25 mg by mouth 2 (two) times daily.   Ofatumumab (KESIMPTA) 20 MG/0.4ML SOAJ Inject into the skin.    Social History: Social History   Tobacco Use   Smoking status: Never   Smokeless tobacco: Never    Family Medical History: No family history on file.  Physical Examination: Vitals:   01/05/23 1130  BP: 90/60  Pulse: 68  SpO2: 98%    General: Patient is well developed, well nourished, calm, collected, and in no apparent distress. Attention to examination is appropriate.  Neck:   Supple.  Full range of motion.  Respiratory: Patient is breathing without any difficulty.   NEUROLOGICAL:     Awake, alert, oriented to person, place, and time.  Speech is clear and fluent. Fund of knowledge is appropriate.   Cranial Nerves: Pupils equal round and reactive to  light.  Facial tone is symmetric.  Facial sensation is symmetric. Shoulder shrug is symmetric. Tongue protrusion is midline.  There is no pronator drift.  ROM of spine: full.    Strength: Side Biceps Triceps Deltoid Interossei Grip Wrist Ext. Wrist Flex.  R '5 5 5 5 5 5 5  '$ L 5 5 4+ 4- 3 4+ 4+   Side Iliopsoas Quads Hamstring PF DF EHL  R '5 5 5 5 5 5  '$ L '5 5 5 5 5 5   '$ Reflexes are 2+ and symmetric at the biceps,  triceps, brachioradialis, patella and achilles.   Hoffman's is absent.   Bilateral upper and lower extremity sensation is intact to light touch.    No evidence of dysmetria noted.  Gait is abnormal and requires a cane.  She has tenderness to palpation in both of her shoulders near her biceps tendon.  She has discomfort with range of motion of her upper extremities.     Medical Decision Making  Imaging: MRI C spine 06/10/2022 IMPRESSION: 1. At C4-5 there is a broad-based disc bulge. Severe left facet arthropathy. Severe left foraminal stenosis. Moderate right foraminal stenosis. Mild spinal stenosis. 2. At C5-6 there is a broad-based disc bulge with a small left paracentral shallow disc protrusion. Severe right foraminal stenosis. Mild left foraminal stenosis. 3. At C6-7 there is a mild broad-based disc bulge. Bilateral uncovertebral degenerative changes. Moderate right and moderate-severe left foraminal stenosis.     Electronically Signed   By: Stefanie Cuevas M.D.   On: 06/13/2022 08:56  I have personally reviewed the images and agree with the above interpretation.  Assessment and Plan: [name] is a pleasant 62 y.o. 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  We discussed that she may have primary shoulder pathology.  Will see what her MRI shows.  Additionally, she has multiple sclerosis which may be clouding her clinical picture.  She has only left-sided symptoms, and has left C6-7 foraminal stenosis.  It is very possible that the substantial portion of her symptoms are due to left C7 radiculopathy.  She has tried and failed physical therapy.  At this point, C6-7 anterior cervical discectomy and fusion could be considered.  Will go ahead and schedule this, but we will wait to see what her MRI of her shoulder shows before making a final plan as to whether cervical spine or shoulder  intervention should be considered first.  I have described to her the diagnostic uncertainty associated with her presentation.  She is understanding of this dilemma.  That being said, she has met indications for cervical surgery.  No further conservative management is indicated at this time.  We discussed C6-7 anterior cervical discectomy and fusion.   I discussed the planned procedure at length with the patient, including the risks, benefits, alternatives, and indications. The risks discussed include but are not limited to bleeding, infection, need for reoperation, spinal fluid leak, stroke, vision loss, anesthetic complication, coma, paralysis, and even death. We also discussed the possibility of post-operative dysphagia, vocal cord paralysis, and the risk of adjacent segment disease in the future. I also described in detail that improvement was not guaranteed.  The patient expressed understanding of these risks, and asked that we proceed with surgery. I described the surgery in layman's terms, and gave ample opportunity for questions, which were answered to the best of my ability.  I spent a total of 10 minutes in  this patient's care today. This time was spent reviewing pertinent records including imaging studies, obtaining and confirming history, performing a directed evaluation, formulating and discussing my recommendations, and documenting the visit within the medical record.   Thank you for involving me in the care of this patient.      Skyylar Kopf K. Izora Ribas MD, [facility] Neurosurgery'

## 2023-01-11 ENCOUNTER — Ambulatory Visit
Admission: RE | Admit: 2023-01-11 | Discharge: 2023-01-11 | Disposition: A | Discharge status: Home / Self Care | Payer: Medicare HMO | Source: Ambulatory Visit | Attending: Surgery | Admitting: Surgery

## 2023-01-11 ENCOUNTER — Other Ambulatory Visit: Payer: Self-pay

## 2023-01-11 DIAGNOSIS — M7582 Other shoulder lesions, left shoulder: Secondary | ICD-10-CM | POA: Insufficient documentation

## 2023-01-11 DIAGNOSIS — Z01818 Encounter for other preprocedural examination: Secondary | ICD-10-CM

## 2023-01-16 ENCOUNTER — Encounter: Payer: Self-pay | Admitting: Neurosurgery

## 2023-01-20 ENCOUNTER — Encounter: Payer: Self-pay | Admitting: Neurosurgery

## 2023-01-20 ENCOUNTER — Encounter
Admission: RE | Admit: 2023-01-20 | Discharge: 2023-01-20 | Disposition: A | Discharge status: Home / Self Care | Payer: Medicare HMO | Source: Ambulatory Visit | Attending: Neurosurgery | Admitting: Neurosurgery

## 2023-01-20 ENCOUNTER — Inpatient Hospital Stay: Admission: RE | Admit: 2023-01-20 | Payer: Medicare HMO | Source: Ambulatory Visit

## 2023-01-20 DIAGNOSIS — E785 Hyperlipidemia, unspecified: Secondary | ICD-10-CM | POA: Diagnosis not present

## 2023-01-20 DIAGNOSIS — Q232 Congenital mitral stenosis: Secondary | ICD-10-CM | POA: Insufficient documentation

## 2023-01-20 DIAGNOSIS — Z01818 Encounter for other preprocedural examination: Secondary | ICD-10-CM | POA: Insufficient documentation

## 2023-01-20 DIAGNOSIS — Z01812 Encounter for preprocedural laboratory examination: Secondary | ICD-10-CM

## 2023-01-20 HISTORY — DX: Other cervical disc degeneration, unspecified cervical region: M50.30

## 2023-01-20 HISTORY — DX: Decreased white blood cell count, unspecified: D72.819

## 2023-01-20 HISTORY — DX: Diverticulosis of intestine, part unspecified, without perforation or abscess without bleeding: K57.90

## 2023-01-20 HISTORY — DX: Personal history of urinary calculi: Z87.442

## 2023-01-20 HISTORY — DX: Morbid (severe) obesity due to excess calories: E66.01

## 2023-01-20 HISTORY — DX: Vitamin D deficiency, unspecified: E55.9

## 2023-01-20 LAB — SURGICAL PCR SCREEN
MRSA, PCR: NEGATIVE
Staphylococcus aureus: NEGATIVE

## 2023-01-20 LAB — BASIC METABOLIC PANEL
Anion gap: 12 (ref 5–15)
BUN: 23 mg/dL (ref 8–23)
CO2: 31 mmol/L (ref 22–32)
Calcium: 9.4 mg/dL (ref 8.9–10.3)
Chloride: 98 mmol/L (ref 98–111)
Creatinine, Ser: 0.61 mg/dL (ref 0.44–1.00)
GFR, Estimated: >60 mL/min (ref >60)
Glucose, Bld: 61 mg/dL — ABNORMAL LOW (ref 70–99)
Potassium: 3.4 mmol/L — ABNORMAL LOW (ref 3.5–5.1)
Sodium: 141 mmol/L (ref 135–145)

## 2023-01-20 LAB — CBC
HCT: 37.5 % (ref 36.0–46.0)
Hemoglobin: 12.3 g/dL (ref 12.0–15.0)
MCH: 30.8 pg (ref 26.0–34.0)
MCHC: 32.8 g/dL (ref 30.0–36.0)
MCV: 94 fL (ref 80.0–100.0)
Platelets: 288 10*3/uL (ref 150–400)
RBC: 3.99 MIL/uL (ref 3.87–5.11)
RDW: 11.9 % (ref 11.5–15.5)
WBC: 4.5 10*3/uL (ref 4.0–10.5)
nRBC: 0 % (ref 0.0–0.2)

## 2023-01-20 LAB — URINALYSIS, ROUTINE W REFLEX MICROSCOPIC
Bacteria, UA: NONE SEEN
Bilirubin Urine: NEGATIVE
Glucose, UA: NEGATIVE mg/dL
Ketones, ur: NEGATIVE mg/dL
Leukocytes,Ua: NEGATIVE
Nitrite: NEGATIVE
Protein, ur: 30 mg/dL — AB
RBC / HPF: >50 RBC/hpf (ref 0–5)
Specific Gravity, Urine: 1.019 (ref 1.005–1.030)
WBC, UA: NONE SEEN WBC/hpf (ref 0–5)
pH: 6 (ref 5.0–8.0)

## 2023-01-20 LAB — TYPE AND SCREEN
ABO/RH(D): O POS
Antibody Screen: NEGATIVE

## 2023-01-20 NOTE — Patient Instructions (Addendum)
Your procedure is scheduled on: Wednesday, April 10 Report to the Registration Desk on the 1st floor of the [facility]. To find out your arrival time, please call [phone] between 1PM - 3PM on: Tuesday, April 9 If your arrival time is 6:00 am, do not arrive before that time as the [facility] entrance doors do not open until 6:00 am.  REMEMBER: Instructions that are not followed completely may result in serious medical risk, up to and including death; or upon the discretion of your surgeon and anesthesiologist your surgery may need to be rescheduled.  Do not eat food after midnight the night before surgery.  No gum chewing or hard candies.  You may however, drink CLEAR liquids up to 2 hours before you are scheduled to arrive for your surgery. Do not drink anything within 2 hours of your scheduled arrival time.  Clear liquids include: - water  - apple juice without pulp - gatorade (not RED colors) - black coffee or tea (Do NOT add milk or creamers to the coffee or tea) Do NOT drink anything that is not on this list.  One week prior to surgery: starting April 3 Stop Anti-inflammatories (NSAIDS) such as Advil, Aleve, Ibuprofen, Motrin, Naproxen, Naprosyn and Aspirin based products such as Excedrin, Goody's Powder, BC Powder. Stop ANY OVER THE COUNTER supplements until after surgery. You may however, continue to take Tylenol if needed for pain up until the day of surgery.  Continue taking all prescribed medications except:  Kesimpta - follow Dr. Rhea Bleacher recommendation of when to resume.  TAKE ONLY THESE MEDICATIONS THE MORNING OF SURGERY WITH A SIP OF WATER:  Baclofen Duloxetine (Cymbalta) Gabapentin Lamotrigine (Lamictal)  No Alcohol for 24 hours before or after surgery.  No Smoking including e-cigarettes for 24 hours before surgery.  No chewable tobacco products for at least 6 hours before surgery.  No nicotine patches on the day of surgery.  Do not use any  "recreational" drugs for at least a week (preferably 2 weeks) before your surgery.  Please be advised that the combination of cocaine and anesthesia may have negative outcomes, up to and including death. If you test positive for cocaine, your surgery will be cancelled.  On the morning of surgery brush your teeth with toothpaste and water, you may rinse your mouth with mouthwash if you wish. Do not swallow any toothpaste or mouthwash.  Use CHG Soap as directed on instruction sheet.  Do not wear jewelry, make-up, hairpins, clips or nail polish.  Do not wear lotions, powders, or perfumes.   Do not shave body hair from the neck down 48 hours before surgery.  Contact lenses, hearing aids and dentures may not be worn into surgery.  Do not bring valuables to the hospital. [facility] is not responsible for any missing/lost belongings or valuables.   Notify your doctor if there is any change in your medical condition (cold, fever, infection).  Wear comfortable clothing (specific to your surgery type) to the hospital.  After surgery, you can help prevent lung complications by doing breathing exercises.  Take deep breaths and cough every 1-2 hours. Your doctor may order a device called an Incentive Spirometer to help you take deep breaths.  If you are being discharged the day of surgery, you will not be allowed to drive home. You will need a responsible individual to drive you home and stay with you for 24 hours after surgery.   If you are taking public transportation, you will need to  have a responsible individual with you.  Please call the [facility] Dept. at [phone] if you have any questions about these instructions.  Surgery Visitation Policy:  Patients having surgery or a procedure may have two visitors.  Children under the age of 75 must have an adult with them who is not the patient.

## 2023-01-24 ENCOUNTER — Encounter: Payer: Self-pay | Admitting: Neurosurgery

## 2023-01-25 ENCOUNTER — Other Ambulatory Visit: Payer: Self-pay | Admitting: Internal Medicine

## 2023-01-25 DIAGNOSIS — R921 Mammographic calcification found on diagnostic imaging of breast: Secondary | ICD-10-CM

## 2023-01-31 MED ORDER — ORAL CARE MOUTH RINSE: 15.0000 mL | Freq: Once | OROMUCOSAL | Status: AC

## 2023-01-31 MED ORDER — FAMOTIDINE 20 MG PO TABS
20.0000 mg | ORAL_TABLET | Freq: Once | ORAL | Status: AC
  Administered 2023-02-01: 20 mg via ORAL

## 2023-01-31 MED ORDER — CHLORHEXIDINE GLUCONATE 0.12 % MT SOLN
15.0000 mL | Freq: Once | OROMUCOSAL | Status: AC
  Administered 2023-02-01: 15 mL via OROMUCOSAL

## 2023-01-31 MED ORDER — CEFAZOLIN IN SODIUM CHLORIDE 2-0.9 GM/100ML-% IV SOLN
2.0000 g | Freq: Once | INTRAVENOUS | Status: DC
  Filled 2023-01-31: qty 100

## 2023-01-31 MED ORDER — GABAPENTIN 300 MG PO CAPS: 300.0000 mg | ORAL_CAPSULE | Freq: Once | ORAL | Status: DC

## 2023-01-31 MED ORDER — ACETAMINOPHEN 500 MG PO TABS
1000.0000 mg | ORAL_TABLET | Freq: Once | ORAL | Status: AC
  Administered 2023-02-01: 1000 mg via ORAL

## 2023-01-31 MED ORDER — LACTATED RINGERS IV SOLN: INTRAVENOUS | Status: DC

## 2023-01-31 MED ORDER — CEFAZOLIN SODIUM-DEXTROSE 2-4 GM/100ML-% IV SOLN
2.0000 g | INTRAVENOUS | Status: AC
  Administered 2023-02-01: 2 g via INTRAVENOUS

## 2023-01-31 NOTE — Anesthesia Preprocedure Evaluation (Signed)
Anesthesia Evaluation  Patient identified by MRN, date of birth, ID band Patient awake    Reviewed: Allergy & Precautions, H&P , NPO status , Patient's Chart, lab work & pertinent test results  Airway Mallampati: II  TM Distance: >3 FB Neck ROM: full    Dental no notable dental hx.    Pulmonary neg pulmonary ROS, former smoker   Pulmonary exam normal        Cardiovascular negative cardio ROS Normal cardiovascular exam     Neuro/Psych Seizures -,  PSYCHIATRIC DISORDERS  Depression    Multiple sclerosis tx with ofatumumab.  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, neck pain, possible cervical radiculopathy, and shoulder pain bilaterally    GI/Hepatic Neg liver ROS,,,S/p Sleeve Gastric Bypass   Endo/Other  negative endocrine ROS    Renal/GU      Musculoskeletal  (+) Arthritis ,    Abdominal   Peds  Hematology negative hematology ROS (+)   Anesthesia Other Findings Past Medical History: No date: Arthritis No date: Degenerative disc disease, cervical No date: Depression No date: Diverticulosis No date: Elevated TSH No date: History of blood transfusion No date: History of kidney stones No date: Hyperlipidemia No date: Leucopenia No date: Morbid obesity with BMI of 40.0-44.9, adult (HCC) No date: Multiple sclerosis (HCC)     Comment:  a.) on current Tx using ofatumumab No date: Osteoarthritis 11/29/2021: Seizures (HCC)     Comment:  had one 11/21/2022 No date: Vitamin D deficiency  Past Surgical History: No date: APPENDECTOMY 10/25/2022: BREAST BIOPSY; Left     Comment:  Stereo Bx, X clip, path pending 10/25/2022: BREAST BIOPSY; Left     Comment:  MM LT BREAST BX W LOC DEV 1ST LESION IMAGE BX SPEC               STEREO GUIDE 10/25/2022 [facility] 03/10/1983: CESAREAN SECTION No date: CHOLECYSTECTOMY 04/20/2019: COLONOSCOPY 04/05/2021: Sleeve Gastric Bypass      Reproductive/Obstetrics negative OB ROS                              Anesthesia Physical Anesthesia Plan  ASA: 2  Anesthesia Plan: General ETT   Post-op Pain Management: Tylenol PO (pre-op)*, Gabapentin PO (pre-op)* and Toradol IV (intra-op)*   Induction: Intravenous  PONV Risk Score and Plan: 2 and Ondansetron, Dexamethasone and Midazolam  Airway Management Planned: Oral ETT  Additional Equipment:   Intra-op Plan:   Post-operative Plan:   Informed Consent:      Dental Advisory Given  Plan Discussed with: CRNA and Surgeon  Anesthesia Plan Comments:          Anesthesia Quick Evaluation

## 2023-02-01 ENCOUNTER — Encounter: Payer: Self-pay | Admitting: Neurosurgery

## 2023-02-01 ENCOUNTER — Ambulatory Visit
Admission: RE | Admit: 2023-02-01 | Discharge: 2023-02-01 | Disposition: A | Discharge status: Home / Self Care | Payer: Medicare HMO | Attending: Neurosurgery | Admitting: Neurosurgery

## 2023-02-01 ENCOUNTER — Ambulatory Visit: Payer: Medicare HMO | Admitting: Urgent Care

## 2023-02-01 ENCOUNTER — Ambulatory Visit: Payer: Medicare HMO

## 2023-02-01 ENCOUNTER — Other Ambulatory Visit: Payer: Self-pay

## 2023-02-01 ENCOUNTER — Encounter
Admission: RE | Disposition: A | Discharge status: Home / Self Care | Payer: Self-pay | Source: Home / Self Care | Attending: Neurosurgery

## 2023-02-01 DIAGNOSIS — M4722 Other spondylosis with radiculopathy, cervical region: Secondary | ICD-10-CM | POA: Diagnosis present

## 2023-02-01 DIAGNOSIS — F32A Depression, unspecified: Secondary | ICD-10-CM | POA: Insufficient documentation

## 2023-02-01 DIAGNOSIS — E785 Hyperlipidemia, unspecified: Secondary | ICD-10-CM | POA: Insufficient documentation

## 2023-02-01 DIAGNOSIS — Z87891 Personal history of nicotine dependence: Secondary | ICD-10-CM | POA: Diagnosis not present

## 2023-02-01 DIAGNOSIS — K5790 Diverticulosis of intestine, part unspecified, without perforation or abscess without bleeding: Secondary | ICD-10-CM | POA: Insufficient documentation

## 2023-02-01 DIAGNOSIS — Z9884 Bariatric surgery status: Secondary | ICD-10-CM | POA: Insufficient documentation

## 2023-02-01 DIAGNOSIS — Z6828 Body mass index (BMI) 28.0-28.9, adult: Secondary | ICD-10-CM | POA: Insufficient documentation

## 2023-02-01 DIAGNOSIS — M5412 Radiculopathy, cervical region: Secondary | ICD-10-CM

## 2023-02-01 DIAGNOSIS — Z01812 Encounter for preprocedural laboratory examination: Secondary | ICD-10-CM

## 2023-02-01 DIAGNOSIS — R569 Unspecified convulsions: Secondary | ICD-10-CM | POA: Diagnosis not present

## 2023-02-01 DIAGNOSIS — G35 Multiple sclerosis: Secondary | ICD-10-CM | POA: Diagnosis not present

## 2023-02-01 DIAGNOSIS — M1990 Unspecified osteoarthritis, unspecified site: Secondary | ICD-10-CM | POA: Diagnosis not present

## 2023-02-01 DIAGNOSIS — Z01818 Encounter for other preprocedural examination: Secondary | ICD-10-CM

## 2023-02-01 DIAGNOSIS — M50123 Cervical disc disorder at C6-C7 level with radiculopathy: Secondary | ICD-10-CM | POA: Diagnosis not present

## 2023-02-01 DIAGNOSIS — M4802 Spinal stenosis, cervical region: Secondary | ICD-10-CM | POA: Insufficient documentation

## 2023-02-01 DIAGNOSIS — Q232 Congenital mitral stenosis: Secondary | ICD-10-CM

## 2023-02-01 HISTORY — PX: ANTERIOR CERVICAL DECOMP/DISCECTOMY FUSION: SHX1161

## 2023-02-01 LAB — ABO/RH: ABO/RH(D): O POS

## 2023-02-01 SURGERY — ANTERIOR CERVICAL DECOMPRESSION/DISCECTOMY FUSION 1 LEVEL
Anesthesia: General | Site: Spine Cervical

## 2023-02-01 MED ORDER — BUPIVACAINE-EPINEPHRINE (PF) 0.5% -1:200000 IJ SOLN
INTRAMUSCULAR | Status: DC | PRN
  Administered 2023-02-01: 5 mL

## 2023-02-01 MED ORDER — LIDOCAINE HCL (CARDIAC) PF 100 MG/5ML IV SOSY
PREFILLED_SYRINGE | INTRAVENOUS | Status: DC | PRN
  Administered 2023-02-01: 100 mg via INTRAVENOUS

## 2023-02-01 MED ORDER — DEXAMETHASONE SODIUM PHOSPHATE 10 MG/ML IJ SOLN
INTRAMUSCULAR | Status: DC | PRN
  Administered 2023-02-01: 10 mg via INTRAVENOUS

## 2023-02-01 MED ORDER — DEXAMETHASONE SODIUM PHOSPHATE 10 MG/ML IJ SOLN
INTRAMUSCULAR | Status: AC
  Filled 2023-02-01: qty 1

## 2023-02-01 MED ORDER — KETAMINE HCL 50 MG/5ML IJ SOSY
PREFILLED_SYRINGE | INTRAMUSCULAR | Status: AC
  Filled 2023-02-01: qty 5

## 2023-02-01 MED ORDER — KETAMINE HCL 10 MG/ML IJ SOLN
INTRAMUSCULAR | Status: DC | PRN
  Administered 2023-02-01: 10 mg via INTRAVENOUS
  Administered 2023-02-01: 20 mg via INTRAVENOUS

## 2023-02-01 MED ORDER — SUCCINYLCHOLINE CHLORIDE 200 MG/10ML IV SOSY
PREFILLED_SYRINGE | INTRAVENOUS | Status: DC | PRN
  Administered 2023-02-01: 80 mg via INTRAVENOUS

## 2023-02-01 MED ORDER — PROPOFOL 10 MG/ML IV BOLUS
INTRAVENOUS | Status: AC
  Filled 2023-02-01: qty 20

## 2023-02-01 MED ORDER — 0.9 % SODIUM CHLORIDE (POUR BTL) OPTIME
TOPICAL | Status: DC | PRN
  Administered 2023-02-01: 500 mL

## 2023-02-01 MED ORDER — MIDAZOLAM HCL 2 MG/2ML IJ SOLN
INTRAMUSCULAR | Status: AC
  Filled 2023-02-01: qty 2

## 2023-02-01 MED ORDER — CEFAZOLIN SODIUM-DEXTROSE 2-4 GM/100ML-% IV SOLN
INTRAVENOUS | Status: AC
  Filled 2023-02-01: qty 100

## 2023-02-01 MED ORDER — FENTANYL CITRATE (PF) 100 MCG/2ML IJ SOLN
INTRAMUSCULAR | Status: DC | PRN
  Administered 2023-02-01 (×2): 50 ug via INTRAVENOUS

## 2023-02-01 MED ORDER — SURGIFLO WITH THROMBIN (HEMOSTATIC MATRIX KIT) OPTIME
TOPICAL | Status: DC | PRN
  Administered 2023-02-01: 1 via TOPICAL

## 2023-02-01 MED ORDER — ONDANSETRON HCL 4 MG/2ML IJ SOLN
INTRAMUSCULAR | Status: AC
  Filled 2023-02-01: qty 2

## 2023-02-01 MED ORDER — DROPERIDOL 2.5 MG/ML IJ SOLN: 0.6250 mg | Freq: Once | INTRAMUSCULAR | Status: DC | PRN

## 2023-02-01 MED ORDER — LIDOCAINE HCL (PF) 2 % IJ SOLN
INTRAMUSCULAR | Status: AC
  Filled 2023-02-01: qty 5

## 2023-02-01 MED ORDER — GABAPENTIN 300 MG PO CAPS
ORAL_CAPSULE | ORAL | Status: AC
  Filled 2023-02-01: qty 1

## 2023-02-01 MED ORDER — CHLORHEXIDINE GLUCONATE 0.12 % MT SOLN
OROMUCOSAL | Status: AC
  Filled 2023-02-01: qty 15

## 2023-02-01 MED ORDER — ACETAMINOPHEN 500 MG PO TABS
ORAL_TABLET | ORAL | Status: AC
  Filled 2023-02-01: qty 2

## 2023-02-01 MED ORDER — OXYCODONE HCL 5 MG PO TABS
5.0000 mg | ORAL_TABLET | Freq: Once | ORAL | Status: AC | PRN
  Administered 2023-02-01: 5 mg via ORAL

## 2023-02-01 MED ORDER — BUPIVACAINE-EPINEPHRINE (PF) 0.5% -1:200000 IJ SOLN
INTRAMUSCULAR | Status: AC
  Filled 2023-02-01: qty 30

## 2023-02-01 MED ORDER — FENTANYL CITRATE (PF) 100 MCG/2ML IJ SOLN
INTRAMUSCULAR | Status: AC
  Filled 2023-02-01: qty 2

## 2023-02-01 MED ORDER — OXYCODONE HCL 5 MG/5ML PO SOLN: 5.0000 mg | Freq: Once | ORAL | Status: AC | PRN

## 2023-02-01 MED ORDER — FAMOTIDINE 20 MG PO TABS
ORAL_TABLET | ORAL | Status: AC
  Filled 2023-02-01: qty 1

## 2023-02-01 MED ORDER — OXYCODONE HCL 5 MG PO TABS
ORAL_TABLET | ORAL | Status: AC
  Filled 2023-02-01: qty 1

## 2023-02-01 MED ORDER — ACETAMINOPHEN 10 MG/ML IV SOLN: 1000.0000 mg | Freq: Once | INTRAVENOUS | Status: DC | PRN

## 2023-02-01 MED ORDER — OXYCODONE HCL 5 MG PO TABS: 5.0000 mg | ORAL_TABLET | ORAL | 0 refills | Status: DC | PRN

## 2023-02-01 MED ORDER — FENTANYL CITRATE (PF) 100 MCG/2ML IJ SOLN
25.0000 ug | INTRAMUSCULAR | Status: DC | PRN
  Administered 2023-02-01: 25 ug via INTRAVENOUS
  Administered 2023-02-01: 50 ug via INTRAVENOUS
  Administered 2023-02-01: 25 ug via INTRAVENOUS

## 2023-02-01 MED ORDER — PROPOFOL 10 MG/ML IV BOLUS
INTRAVENOUS | Status: DC | PRN
  Administered 2023-02-01: 150 mg via INTRAVENOUS

## 2023-02-01 MED ORDER — PROMETHAZINE HCL 25 MG/ML IJ SOLN: 6.2500 mg | INTRAMUSCULAR | Status: DC | PRN

## 2023-02-01 MED ORDER — SUCCINYLCHOLINE CHLORIDE 200 MG/10ML IV SOSY
PREFILLED_SYRINGE | INTRAVENOUS | Status: AC
  Filled 2023-02-01: qty 10

## 2023-02-01 MED ORDER — MIDAZOLAM HCL 2 MG/2ML IJ SOLN
INTRAMUSCULAR | Status: DC | PRN
  Administered 2023-02-01 (×2): 1 mg via INTRAVENOUS

## 2023-02-01 SURGICAL SUPPLY — 52 items
ADH SKN CLS APL DERMABOND .7 (GAUZE/BANDAGES/DRESSINGS) ×1
AGENT HMST KT MTR STRL THRMB (HEMOSTASIS) ×1
BASIN KIT SINGLE STR (MISCELLANEOUS) ×1 IMPLANT
BASKET BONE COLLECTION (BASKET) IMPLANT
BULB RESERV EVAC DRAIN JP 100C (MISCELLANEOUS) IMPLANT
BUR NEURO DRILL SOFT 3.0X3.8M (BURR) ×1 IMPLANT
DERMABOND ADVANCED .7 DNX12 (GAUZE/BANDAGES/DRESSINGS) ×1 IMPLANT
DRAIN CHANNEL JP 10F RND 20C F (MISCELLANEOUS) IMPLANT
DRAPE C ARM PK CFD 31 SPINE (DRAPES) ×1 IMPLANT
DRAPE LAPAROTOMY 77X122 PED (DRAPES) ×1 IMPLANT
DRAPE MICROSCOPE SPINE 48X150 (DRAPES) ×1 IMPLANT
DRSG OPSITE POSTOP 4X6 (GAUZE/BANDAGES/DRESSINGS) ×2 IMPLANT
ELECT REM PT RETURN 9FT ADLT (ELECTROSURGICAL) ×1
ELECTRODE REM PT RTRN 9FT ADLT (ELECTROSURGICAL) ×1 IMPLANT
FEE INTRAOP CADWELL SUPPLY NCS (MISCELLANEOUS) IMPLANT
FEE INTRAOP MONITOR IMPULS NCS (MISCELLANEOUS) IMPLANT
GLOVE BIOGEL PI IND STRL 6.5 (GLOVE) ×1 IMPLANT
GLOVE SURG SYN 6.5 ES PF (GLOVE) ×1 IMPLANT
GLOVE SURG SYN 6.5 PF PI (GLOVE) ×1 IMPLANT
GLOVE SURG SYN 8.5  E (GLOVE) ×3
GLOVE SURG SYN 8.5 E (GLOVE) ×3 IMPLANT
GLOVE SURG SYN 8.5 PF PI (GLOVE) ×3 IMPLANT
GOWN SRG LRG LVL 4 IMPRV REINF (GOWNS) ×1 IMPLANT
GOWN SRG XL LVL 3 NONREINFORCE (GOWNS) ×1 IMPLANT
GOWN STRL NON-REIN TWL XL LVL3 (GOWNS) ×1
GOWN STRL REIN LRG LVL4 (GOWNS) ×1
INTRAOP CADWELL SUPPLY FEE NCS (MISCELLANEOUS)
INTRAOP DISP SUPPLY FEE NCS (MISCELLANEOUS)
INTRAOP MONITOR FEE IMPULS NCS (MISCELLANEOUS)
INTRAOP MONITOR FEE IMPULSE (MISCELLANEOUS)
KIT TURNOVER KIT A (KITS) ×1 IMPLANT
MANIFOLD NEPTUNE II (INSTRUMENTS) ×1 IMPLANT
NDL SAFETY ECLIP 18X1.5 (MISCELLANEOUS) ×1 IMPLANT
NS IRRIG 1000ML POUR BTL (IV SOLUTION) ×1 IMPLANT
PACK LAMINECTOMY NEURO (CUSTOM PROCEDURE TRAY) ×1 IMPLANT
PAD ARMBOARD 7.5X6 YLW CONV (MISCELLANEOUS) ×2 IMPLANT
PIN CASPAR 14 (PIN) ×1 IMPLANT
PIN CASPAR 14MM (PIN) ×1
PLATE ACP 1.6 18 (Plate) IMPLANT
SCREW ACP VA SD 3.5X15 (Screw) IMPLANT
SPACER CERVICAL FRGE 12X14X8-7 (Spacer) IMPLANT
SPONGE KITTNER 5P (MISCELLANEOUS) ×1 IMPLANT
STAPLER SKIN PROX 35W (STAPLE) IMPLANT
SURGIFLO W/THROMBIN 8M KIT (HEMOSTASIS) ×1 IMPLANT
SUT DVC VLOC 3-0 CL 6 P-12 (SUTURE) IMPLANT
SUT V-LOC 90 ABS DVC 3-0 CL (SUTURE) ×1 IMPLANT
SUT VIC AB 3-0 SH 8-18 (SUTURE) ×1 IMPLANT
SYR 20ML LL LF (SYRINGE) ×1 IMPLANT
TAPE CLOTH 3X10 WHT NS LF (GAUZE/BANDAGES/DRESSINGS) ×3 IMPLANT
TRAP FLUID SMOKE EVACUATOR (MISCELLANEOUS) ×1 IMPLANT
TRAY FOLEY MTR SLVR 16FR STAT (SET/KITS/TRAYS/PACK) IMPLANT
WATER STERILE IRR 1000ML POUR (IV SOLUTION) ×2 IMPLANT

## 2023-02-01 NOTE — Anesthesia Procedure Notes (Signed)
Procedure Name: Intubation Date/Time: 02/01/2023 9:29 AM  Performed by: Omer Jack, CRNAPre-anesthesia Checklist: Patient identified, Patient being monitored, Timeout performed, Emergency Drugs available and Suction available Patient Re-evaluated:Patient Re-evaluated prior to induction Oxygen Delivery Method: Circle system utilized Preoxygenation: Pre-oxygenation with 100% oxygen Induction Type: IV induction Ventilation: Mask ventilation without difficulty Laryngoscope Size: Mac and 3 Grade View: Grade II Tube type: Oral Tube size: 6.5 mm Number of attempts: 1 Airway Equipment and Method: Stylet Placement Confirmation: ETT inserted through vocal cords under direct vision, positive ETCO2 and breath sounds checked- equal and bilateral Secured at: 21 cm Tube secured with: Tape Dental Injury: Teeth and Oropharynx as per pre-operative assessment

## 2023-02-01 NOTE — Progress Notes (Signed)
Patient ate, used restroom, and ambulated around the post-op area, meeting all criteria for discharge.

## 2023-02-01 NOTE — Interval H&P Note (Signed)
History and Physical Interval Note:  02/01/2023 8:55 AM  Stefanie Cuevas  has presented today for surgery, with the diagnosis of M54.12 cervical radiculopathy.  The various methods of treatment have been discussed with the patient and family. After consideration of risks, benefits and other options for treatment, the patient has consented to  Procedure(s): C6-7 ANTERIOR CERVICAL DISCECTOMY AND FUSION (N/A) as a surgical intervention.  The patient's history has been reviewed, patient examined, no change in status, stable for surgery.  I have reviewed the patient's chart and labs.  Questions were answered to the patient's satisfaction.    Heart sounds normal no MRG. Chest Clear to Auscultation Bilaterally.   Michelle Vanhise

## 2023-02-01 NOTE — Anesthesia Postprocedure Evaluation (Signed)
Anesthesia Post Note  Patient: Stefanie Cuevas  Procedure(s) Performed: C6-7 ANTERIOR CERVICAL DISCECTOMY AND FUSION (Spine Cervical)  Patient location during evaluation: Endoscopy Anesthesia Type: General Level of consciousness: awake and alert Pain management: pain level controlled Vital Signs Assessment: post-procedure vital signs reviewed and stable Respiratory status: spontaneous breathing, nonlabored ventilation and respiratory function stable Cardiovascular status: blood pressure returned to baseline and stable Postop Assessment: no apparent nausea or vomiting Anesthetic complications: no   No notable events documented.   Last Vitals:  Vitals:   02/01/23 1220 02/01/23 1308  BP: 128/81 119/74  Pulse: 68 66  Resp: 16 15  Temp: 36.5 C 36.7 C  SpO2: 96% 98%    Last Pain:  Vitals:   02/01/23 1220  TempSrc: Temporal  PainSc: 0-No pain                 Foye Deer

## 2023-02-01 NOTE — Discharge Summary (Signed)
Physician Discharge Summary  Patient ID: Stefanie Cuevas MRN: [id number] DOB/AGE: 1961/10/02 62 y.o.  Admit date: 02/01/2023 Discharge date: 02/01/2023  Admission Diagnoses:  Discharge Diagnoses:  Active Problems:   Cervical radiculopathy   Discharged Condition: good  Hospital Course: Admitted for planed surgery of ACDF C6-C7. She did well postop and was found stable for discharge home. She was discharged on oxycodone. She is already taking baclofen.   Consults:  radiology to read studies  Significant Diagnostic Studies: localization xrays in OR  Treatments: ACDF C6-C7  Discharge Exam: Blood pressure 120/77, pulse 79, temperature (!) 96.9 F (36.1 C), resp. rate 13, height 5\' 2"  (1.575 m), weight 69.9 kg, SpO2 100 %. She is moving all extremities and is alert and oriented.   Disposition: Discharge disposition: 01-Home or Self Care       Discharge Instructions     Diet general   Complete by: As directed    Full liquids to advance to regular as tolerated.   Discharge   Complete by: As directed    Increase activity slowly   Complete by: As directed       Allergies as of 02/01/2023   No Known Allergies      Medication List     STOP taking these medications    Kesimpta 20 MG/0.4ML Soaj Generic drug: Ofatumumab       TAKE these medications    baclofen 10 MG tablet Commonly known as: LIORESAL Take 10 mg by mouth 4 (four) times daily.   DULoxetine 20 MG capsule Commonly known as: CYMBALTA Take 20 mg by mouth daily.   gabapentin 100 MG capsule Commonly known as: NEURONTIN Take 2 capsules by mouth 3 (three) times daily.   lamoTRIgine 25 MG tablet Commonly known as: LAMICTAL Take 25 mg by mouth 2 (two) times daily.   oxyCODONE 5 MG immediate release tablet Commonly known as: Roxicodone Take 1 tablet (5 mg total) by mouth every 4 (four) hours as needed for severe pain.        Follow-up Information     Susanne Borders, PA Follow up.    Specialty: Neurosurgery Why: as scheduled on 02/16/23. Contact information: [address] [place] [address] [phone]         Wandra Arthurs, PA-C .   Specialty: Family Medicine Contact information: [address] [place] [address] [phone]                 Signed: Smiley Houseman 02/01/2023, 11:25 AM

## 2023-02-01 NOTE — Op Note (Signed)
Indications: Stefanie Cuevas is a 62 yo female who presented with M54.12 cervical radiculopathy . She failed conservative management prompting surgical intervention.  Findings: foraminal stenosis  Preoperative Diagnosis: M54.12 cervical radiculopathy  Postoperative Diagnosis: same   EBL: 25 ml IVF: see AR ml Drains: none Disposition: Extubated and Stable to PACU Complications: none  No foley catheter was placed.   Preoperative Note:   Risks of surgery discussed include: infection, bleeding, stroke, coma, death, paralysis, CSF leak, nerve/spinal cord injury, numbness, tingling, weakness, complex regional pain syndrome, recurrent stenosis and/or disc herniation, vascular injury, development of instability, neck/back pain, need for further surgery, persistent symptoms, development of deformity, and the risks of anesthesia. The patient understood these risks and agreed to proceed.  Procedure:  1) Anterior cervical diskectomy and fusion at C6-7 2) Anterior cervical instrumentation at C6-7 3) Structural allograft consisting of corticocancellous allograft   Procedure: After obtaining informed consent, the patient taken to the operating room, placed in supine position, general anesthesia induced.  The patient had a small shoulder roll placed behind their shoulders.  The patient received preop antibiotics and IV Decadron.  The patient had a neck incision outlined, was prepped and draped in usual sterile fashion. The incision was injected with local anesthetic.   An incision was opened, dissection taken down medial to the carotid artery and jugular vein, lateral to the trachea and esophagus.  The prevertebral fascia identified and a localizing x-ray demonstrated the correct level.  The longus colli were dissected laterally, and self-retaining retractors placed to open the operative field. The microscope was then brought into the field.  With this complete, distractor pins were placed in the vertebral  bodies of C6 and C7. The distractor was placed, and the annulus at C6/7 was opened using a bovie.  Curettes and pituitary rongeurs used to remove the majority of disk, then the drill was used to remove the posterior osteophyte and begin the foraminotomies. The nerve hook was used to elevate the posterior longitudinal ligament, which was then removed with Kerrison rongeurs. The microblunt nerve hook could be passed out the foramen bilaterally.   Meticulous hemostasis obtained.  Structural allograft (Gl[place]us [place]) was tapped behind the anterior lip of the vertebral body at C6/7 (8 mm).    The caspar distractor was removed, and bone wax used for hemostasis. A separate, 18 mm Nuvasive ACP plate was chosen.  Two screws placed in each vertebral body, respectively making sure the screws were behind the locking mechanism.  Final AP and lateral radiographs were taken.   Please note that the plate is not inclusive to the interbody structural allograft.  The anchoring mechanism of the plate is completely separate from the allograft.  With everything in good position, the wound was irrigated copiously and meticulous hemostasis obtained.  Wound was closed in 2 layers using interrupted inverted 3-0 Vicryl sutures.  The wound was dressed with dermabond, the head of bed at 30 degrees, taken to recovery room in stable condition.  No new postop neurological deficits were identified.  Sponge and pattie counts were correct at the end of the procedure.    I performed the entire procedure with Drake Leach PA as an Designer, television/film set. An assistant was required for this procedure due to the complexity.  The assistant provided assistance in tissue manipulation and suction, and was required for the successful and safe performance of the procedure. I performed the critical portions of the procedure.   Venetia Night MD

## 2023-02-01 NOTE — Discharge Instructions (Addendum)
AMBULATORY SURGERY  DISCHARGE INSTRUCTIONS   The drugs that you were given will stay in your system until tomorrow so for the next 24 hours you should not:  Drive an automobile Make any legal decisions Drink any alcoholic beverage   You may resume regular meals tomorrow.  Today it is better to start with liquids and gradually work up to solid foods.  You may eat anything you prefer, but it is better to start with liquids, then soup and crackers, and gradually work up to solid foods.   Please notify your doctor immediately if you have any unusual bleeding, trouble breathing, redness and pain at the surgery site, drainage, fever, or pain not relieved by medication.    Additional Instructions:        Please contact your physician with any problems or Same Day Surgery at [phone], Monday through Friday 6 am to 4 pm, or [place] at [facility] Main number at [phone]. Your surgeon has performed an operation on your cervical spine (neck) to relieve pressure on the spinal cord and/or nerves. This involved making an incision in the front of your neck and removing one or more of the discs that support your spine. Next, a small piece of bone, a titanium plate, and screws were used to fuse two or more of the vertebrae (bones) together.  The following are instructions to help in your recovery once you have been discharged from the hospital. Even if you feel well, it is important that you follow these activity guidelines. If you do not let your neck heal properly from the surgery, you can increase the chance of return of your symptoms and other complications.  * Do not take anti-inflammatory medications for 3 months after surgery (naproxen [Aleve], ibuprofen [Advil, Motrin], etc.). These medications can prevent your bones from healing properly.  Celebrex, if prescribed, is ok to take.  Activity    No bending, lifting, or twisting ("BLT"). Avoid lifting objects heavier than 10 pounds  (gallon milk jug).  Where possible, avoid household activities that involve lifting, bending, reaching, pushing, or pulling such as laundry, vacuuming, grocery shopping, and childcare. Try to arrange for help from friends and family for these activities while your back heals.  Increase physical activity slowly as tolerated.  Taking short walks is encouraged, but avoid strenuous exercise. Do not jog, run, bicycle, lift weights, or participate in any other exercises unless specifically allowed by your doctor.  Talk to your doctor before resuming sexual activity.  You should not drive until cleared by your doctor.  Until released by your doctor, you should not return to work or school.  You should rest at home and let your body heal.   You may shower three days after your surgery.  After showering, lightly dab your incision dry. Do not take a tub bath or go swimming until approved by your doctor at your follow-up appointment.  If your doctor ordered a cervical collar (neck brace) for you, you should wear it whenever you are out of bed. You may remove it when lying down or sleeping, but you should wear it at all other times. Not all neck surgeries require a cervical collar.  If you smoke, we strongly recommend that you quit.  Smoking has been proven to interfere with normal bone healing and will dramatically reduce the success rate of your surgery. Please contact [facility] (800-QUIT-NOW) and use the resources at www.[facility].com for assistance in stopping smoking.  Surgical Incision   If you have a  dressing on your incision, you may remove it two days after your surgery. Keep your incision area clean and dry.  You have Dermabond glue on your incision.  The glue should begin to peel away within about a week (it is fine if the steri-strips fall off before then). If the strips are still in place one week after your surgery, you may gently remove them.  Diet           You may return to your usual  diet. However, you may experience discomfort when swallowing in the first month after your surgery. This is normal. You may find that softer foods are more comfortable for you to swallow. Be sure to stay hydrated.  When to Contact us  You may experience pain in your neck and/or pain between your shoulder blades. This is normal and should improve in the next few weeks with the help of pain medication, muscle relaxers, and rest. Some patients report that a warm compress on the back of the neck or between the shoulder blades helps.  However, should you experience any of the following, contact us immediately: New numbness or weakness Pain that is progressively getting worse, and is not relieved by your pain medication, muscle relaxers, rest, and warm compresses Bleeding, redness, swelling, pain, or drainage from surgical incision Chills or flu-like symptoms Fever greater than 101.0 F (38.3 C) Inability to eat, drink fluids, or take medications Problems with bowel or bladder functions Difficulty breathing or shortness of breath Warmth, tenderness, or swelling in your calf Contact Information During office hours (Monday-Friday 9 am to 5 pm), please call your physician at [phone] and ask for Sharlot Gowda After hours and weekends, please call [phone] and speak with the neurosurgeon on call For a life-threatening emergency, call 911

## 2023-02-01 NOTE — Transfer of Care (Signed)
Immediate Anesthesia Transfer of Care Note  Patient: Stefanie Cuevas  Procedure(s) Performed: C6-7 ANTERIOR CERVICAL DISCECTOMY AND FUSION (Spine Cervical)  Patient Location: PACU  Anesthesia Type:General  Level of Consciousness: drowsy and patient cooperative  Airway & Oxygen Therapy: Patient Spontanous Breathing and Patient connected to nasal cannula oxygen  Post-op Assessment: Report given to RN and Post -op Vital signs reviewed and stable  Post vital signs: Reviewed and stable  Last Vitals:  Vitals Value Taken Time  BP 120/77 02/01/23 1115  Temp 36.1 C 02/01/23 1111  Pulse 80 02/01/23 1118  Resp 14 02/01/23 1118  SpO2 100 % 02/01/23 1118  Vitals shown include unvalidated device data.  Last Pain:  Vitals:   02/01/23 1111  TempSrc:   PainSc: 0-No pain         Complications: No notable events documented.

## 2023-02-02 ENCOUNTER — Encounter: Payer: Self-pay | Admitting: Neurosurgery

## 2023-02-16 ENCOUNTER — Ambulatory Visit (INDEPENDENT_AMBULATORY_CARE_PROVIDER_SITE_OTHER): Payer: Medicare HMO | Admitting: Neurosurgery

## 2023-02-16 ENCOUNTER — Encounter: Payer: Self-pay | Admitting: Neurosurgery

## 2023-02-16 VITALS — BP 110/72 | HR 65 | Ht 62.0 in | Wt 154.0 lb

## 2023-02-16 DIAGNOSIS — Z981 Arthrodesis status: Secondary | ICD-10-CM

## 2023-02-16 DIAGNOSIS — Z09 Encounter for follow-up examination after completed treatment for conditions other than malignant neoplasm: Secondary | ICD-10-CM

## 2023-02-16 DIAGNOSIS — M5412 Radiculopathy, cervical region: Secondary | ICD-10-CM

## 2023-02-16 NOTE — Progress Notes (Signed)
   REFERRING PHYSICIAN:  Gracelyn Nurse, Md [address] [place],  [place] [address]  DOS: 02/01/23 C6-7 ACDF   HISTORY OF PRESENT ILLNESS: Denesia Donelan is about 2 weeks status post ACDF. Overall, she is doing very well postoperatively.  She does continue to have some numbness and tingling in her left arm when she attributes largely to her underlying history of MS.  She states that her radiating left arm pain has completely resolved.  She is having some discomfort at the base of her neck but overall is pleased with her postoperative recovery thus far.  PHYSICAL EXAMINATION:  NEUROLOGICAL:  General: In no acute distress.   Awake, alert, oriented to person, place, and time.  Pupils equal round and reactive to light.  Facial tone is symmetric.  Strength: Side Biceps Triceps Deltoid Interossei Grip Wrist Ext. Wrist Flex.  R L 5 5 4+ 4- Incision c/d/I  Imaging:  No interval imaging to review  Assessment / Plan: Emilie Carp is doing well after recent ACDF.  She did have a recent hospitalization for a kidney stone and is taking pain medication for this but is not currently taking any pain medication for her neck.  We discussed activity escalation and I have advised the patient to lift up to 10 pounds until 6 weeks after surgery, then increase up to 25 pounds until 12 weeks after surgery.  After 12 weeks post-op, the patient advised to increase activity as tolerated. she will return to clinic in approximately 4 weeks with cervical x-rays or sooner should she have any questions or concerns.  She expressed understanding was in agreement with this plan.   Advised to contact the office if any questions or concerns arise.   Manning Charity PA-C Dept of Neurosurgery

## 2023-03-14 ENCOUNTER — Encounter: Payer: Self-pay | Admitting: Neurosurgery

## 2023-03-14 ENCOUNTER — Ambulatory Visit
Admission: RE | Admit: 2023-03-14 | Discharge: 2023-03-14 | Disposition: A | Discharge status: Home / Self Care | Payer: Medicare HMO | Source: Ambulatory Visit | Attending: Neurosurgery | Admitting: Neurosurgery

## 2023-03-14 ENCOUNTER — Other Ambulatory Visit: Payer: Self-pay

## 2023-03-14 ENCOUNTER — Ambulatory Visit (INDEPENDENT_AMBULATORY_CARE_PROVIDER_SITE_OTHER): Payer: Medicare HMO | Admitting: Neurosurgery

## 2023-03-14 VITALS — BP 118/78 | Temp 98.5°F | Ht 62.0 in | Wt 164.4 lb

## 2023-03-14 DIAGNOSIS — M5412 Radiculopathy, cervical region: Secondary | ICD-10-CM

## 2023-03-14 DIAGNOSIS — Z09 Encounter for follow-up examination after completed treatment for conditions other than malignant neoplasm: Secondary | ICD-10-CM

## 2023-03-14 NOTE — Progress Notes (Signed)
   REFERRING PHYSICIAN:  Gracelyn Nurse, Md [address] [place],  [place] [address]  DOS: 02/01/23 C6-7 ACDF   HISTORY OF PRESENT ILLNESS: Stefanie Cuevas is status post ACDF.   She is doing very well.  She has minimal discomfort.  PHYSICAL EXAMINATION:  NEUROLOGICAL:  General: In no acute distress.   Awake, alert, oriented to person, place, and time.  Pupils equal round and reactive to light.  Facial tone is symmetric.  Strength: Side Biceps Triceps Deltoid Interossei Grip Wrist Ext. Wrist Flex.  R 5 5 5 5 5 5 5   L 5 5 4+ 4 3 4 4    Incision c/d/I  Imaging:  No complications noted  Assessment / Plan: Stefanie Cuevas is doing well after recent ACDF.  She is doing extremely well.  I am very pleased with her improvements.  We started some exercises for her neck.  Will see her back in 6 weeks.  Reviewed activity limitations.   Venetia Night Dept of Neurosurgery

## 2023-04-21 ENCOUNTER — Other Ambulatory Visit: Payer: Self-pay

## 2023-04-21 DIAGNOSIS — M5412 Radiculopathy, cervical region: Secondary | ICD-10-CM

## 2023-04-25 ENCOUNTER — Ambulatory Visit (INDEPENDENT_AMBULATORY_CARE_PROVIDER_SITE_OTHER): Payer: Medicare HMO | Admitting: Neurosurgery

## 2023-04-25 ENCOUNTER — Ambulatory Visit
Admission: RE | Admit: 2023-04-25 | Discharge: 2023-04-25 | Disposition: A | Discharge status: Home / Self Care | Payer: Medicare HMO | Source: Ambulatory Visit | Attending: Neurosurgery | Admitting: Neurosurgery

## 2023-04-25 ENCOUNTER — Ambulatory Visit
Admission: RE | Admit: 2023-04-25 | Discharge: 2023-04-25 | Disposition: A | Discharge status: Home / Self Care | Payer: Medicare HMO | Attending: Neurosurgery | Admitting: Neurosurgery

## 2023-04-25 ENCOUNTER — Encounter: Payer: Self-pay | Admitting: Neurosurgery

## 2023-04-25 VITALS — BP 111/71 | HR 63 | Temp 97.9°F | Wt 155.4 lb

## 2023-04-25 DIAGNOSIS — M5412 Radiculopathy, cervical region: Secondary | ICD-10-CM

## 2023-04-25 DIAGNOSIS — Z981 Arthrodesis status: Secondary | ICD-10-CM

## 2023-04-25 NOTE — Progress Notes (Signed)
   REFERRING PHYSICIAN:  Gracelyn Nurse, Md [address] [place],  [place] [address]  DOS: 02/01/23 C6-7 ACDF   HISTORY OF PRESENT ILLNESS: Stefanie Cuevas is status post ACDF.  04/25/23 Stefanie Cuevas presents today 3 months s/p ACDF. She is doing well without any significant concerns. She does have some mild soreness in her incision but is otherwise doing well.  03/14/23 She is doing very well.  She has minimal discomfort.  PHYSICAL EXAMINATION:  NEUROLOGICAL:  General: In no acute distress.   Awake, alert, oriented to person, place, and time.    Strength: Side Biceps Triceps Deltoid Interossei Grip Wrist Ext. Wrist Flex.  R 5 5 5 5 5 5 5   L 5 5 5 4 3 4 4    Incision c/d/I and well healed  Imaging:  No complications noted  Assessment / Plan: Stefanie Cuevas is doing well after recent ACDF.  She is very pleased with her postoperative recovery thus far!  We will see her back in 6 months with cervical x-rays prior.  She was encouraged to call the office in the interim should she have any questions or concerns.  She expressed understanding and was in agreement with this plan.  I spent a total of 10 minutes in both face-to-face and non-face-to-face activities for this visit on the date of this encounter.   Manning Charity PA-C Dept of Neurosurgery

## 2023-05-01 ENCOUNTER — Ambulatory Visit
Admission: RE | Admit: 2023-05-01 | Discharge: 2023-05-01 | Disposition: A | Discharge status: Home / Self Care | Payer: Medicare HMO | Source: Ambulatory Visit | Attending: Internal Medicine | Admitting: Internal Medicine

## 2023-05-01 DIAGNOSIS — R921 Mammographic calcification found on diagnostic imaging of breast: Secondary | ICD-10-CM

## 2023-10-23 ENCOUNTER — Encounter: Payer: Self-pay | Admitting: Neurosurgery

## 2023-10-24 ENCOUNTER — Other Ambulatory Visit: Payer: Self-pay

## 2023-10-24 DIAGNOSIS — M5412 Radiculopathy, cervical region: Secondary | ICD-10-CM

## 2023-10-26 ENCOUNTER — Ambulatory Visit: Payer: Medicare HMO | Admitting: Neurosurgery

## 2023-10-26 ENCOUNTER — Encounter: Payer: Self-pay | Admitting: Neurosurgery

## 2023-10-26 ENCOUNTER — Ambulatory Visit
Admission: RE | Admit: 2023-10-26 | Discharge: 2023-10-26 | Disposition: A | Discharge status: Home / Self Care | Payer: Medicare HMO | Source: Ambulatory Visit | Attending: Neurosurgery | Admitting: Neurosurgery

## 2023-10-26 ENCOUNTER — Ambulatory Visit
Admission: RE | Admit: 2023-10-26 | Discharge: 2023-10-26 | Disposition: A | Discharge status: Home / Self Care | Payer: Medicare HMO | Attending: Neurosurgery | Admitting: Neurosurgery

## 2023-10-26 VITALS — BP 114/82 | Ht 62.0 in | Wt 142.0 lb

## 2023-10-26 DIAGNOSIS — M5412 Radiculopathy, cervical region: Secondary | ICD-10-CM | POA: Insufficient documentation

## 2023-10-26 DIAGNOSIS — Z09 Encounter for follow-up examination after completed treatment for conditions other than malignant neoplasm: Secondary | ICD-10-CM

## 2023-10-26 DIAGNOSIS — Z981 Arthrodesis status: Secondary | ICD-10-CM

## 2023-10-26 NOTE — Progress Notes (Signed)
   REFERRING PHYSICIAN:  Rudolpho Norleen BIRCH, Md [address] [place],  [place] [address]  DOS: 02/01/23 C6-7 ACDF   HISTORY OF PRESENT ILLNESS: Kristiann Noyce is status post ACDF.  10/26/2023 She is doing very well.  04/25/23 Charleen Porch presents today 3 months s/p ACDF. She is doing well without any significant concerns. She does have some mild soreness in her incision but is otherwise doing well.  03/14/23 She is doing very well.  She has minimal discomfort.  PHYSICAL EXAMINATION:  NEUROLOGICAL:  General: In no acute distress.   Awake, alert, oriented to person, place, and time.    Strength: Side Biceps Triceps Deltoid Interossei Grip Wrist Ext. Wrist Flex.  R 5 5 5 5 5 5 5   L 5 5 5 4 3 4 4    Incision c/d/I and well healed  Imaging:  No complications noted  Assessment / Plan: Rosaland Shiffman is doing well after recent ACDF.  I am very pleased with her improvements.  She appears to be healing on her x-rays.   I will see her back on an as-needed basis.   I spent a total of 10 minutes in both face-to-face and non-face-to-face activities for this visit on the date of this encounter.   Reeves Daisy MD Dept of Neurosurgery

## 2024-04-14 IMAGING — MG MM DIGITAL SCREENING BILAT W/ TOMO AND CAD
6 of 10 series · 6 of 30 positions shown · non-contrast
Comparison: Previous exam(s).

CLINICAL DATA: Screening.

EXAM:
DIGITAL SCREENING BILATERAL MAMMOGRAM WITH TOMOSYNTHESIS AND CAD
TECHNIQUE: Bilateral screening digital craniocaudal and mediolateral oblique
mammograms were obtained. Bilateral screening digital breast
tomosynthesis was performed. The images were evaluated with
computer-aided detection.

[L MLO synth-2D]
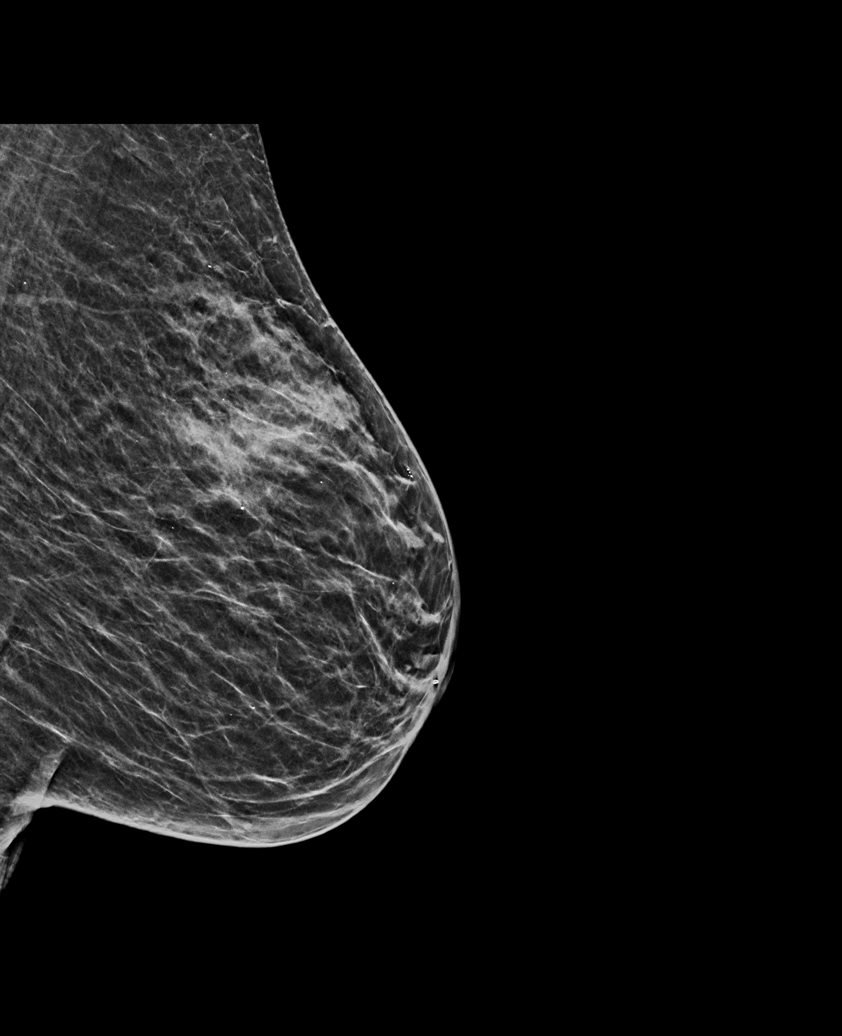

[R MLO synth-2D (1 of 2)]
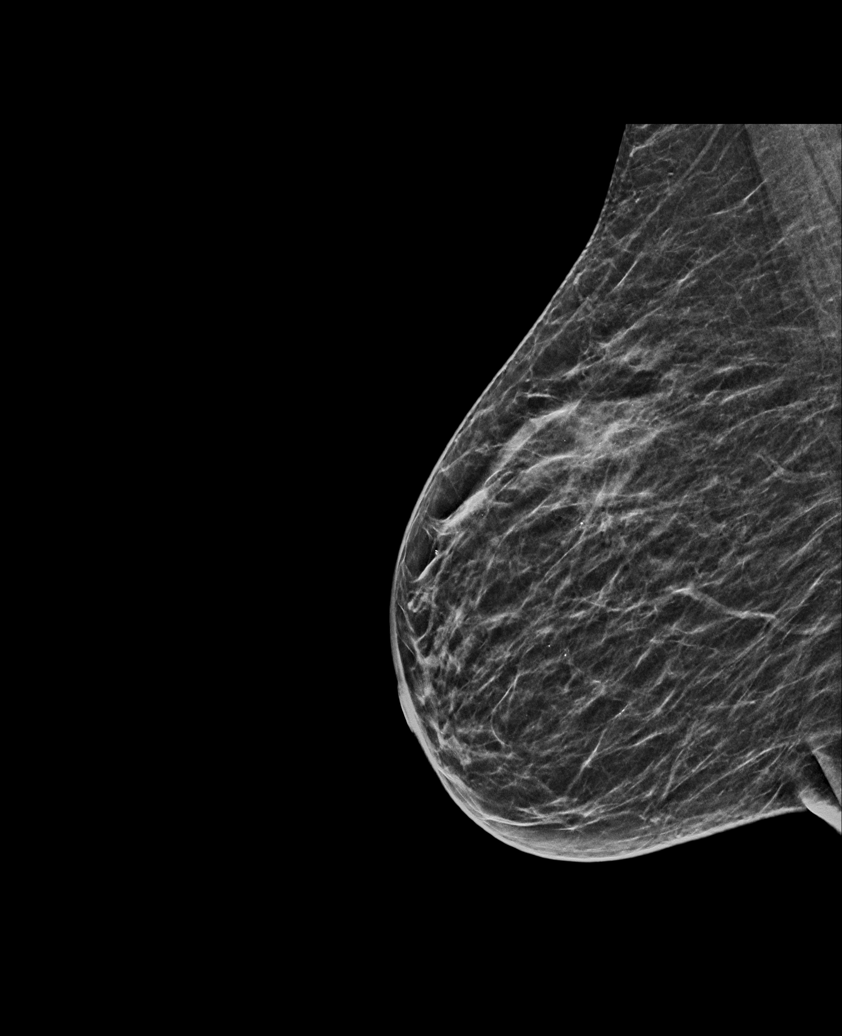

[R CC synth-2D]
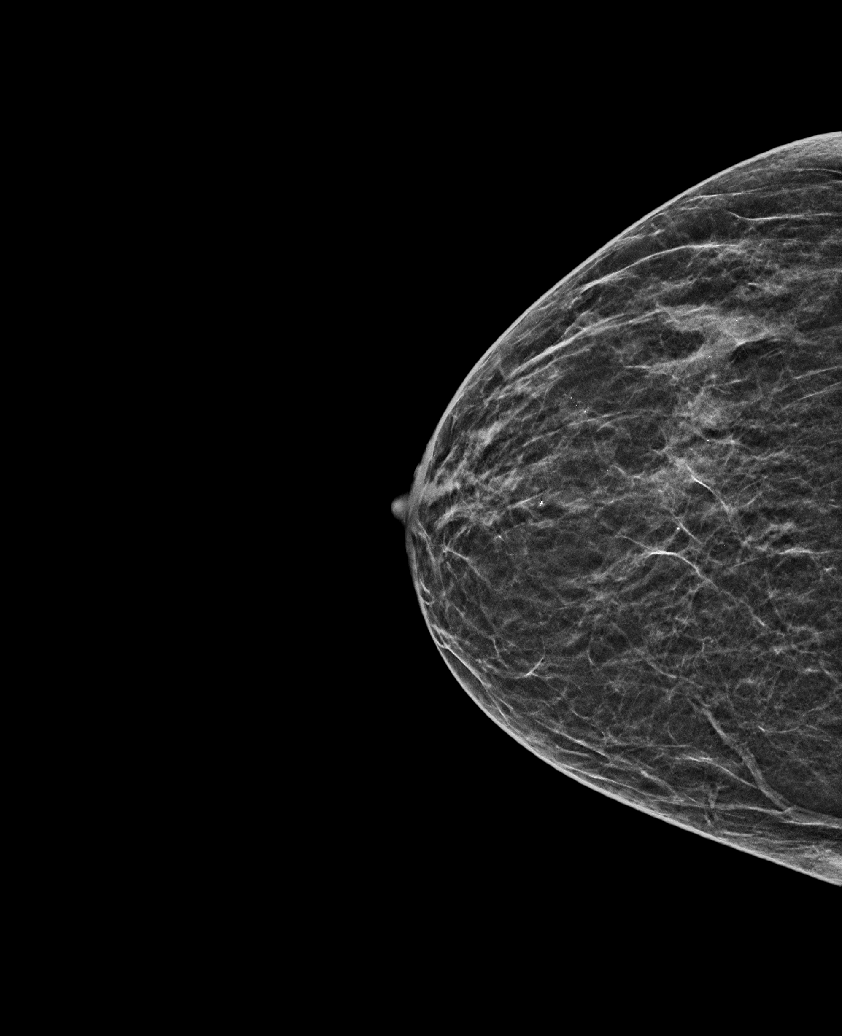

[R MLO synth-2D (2 of 2)]
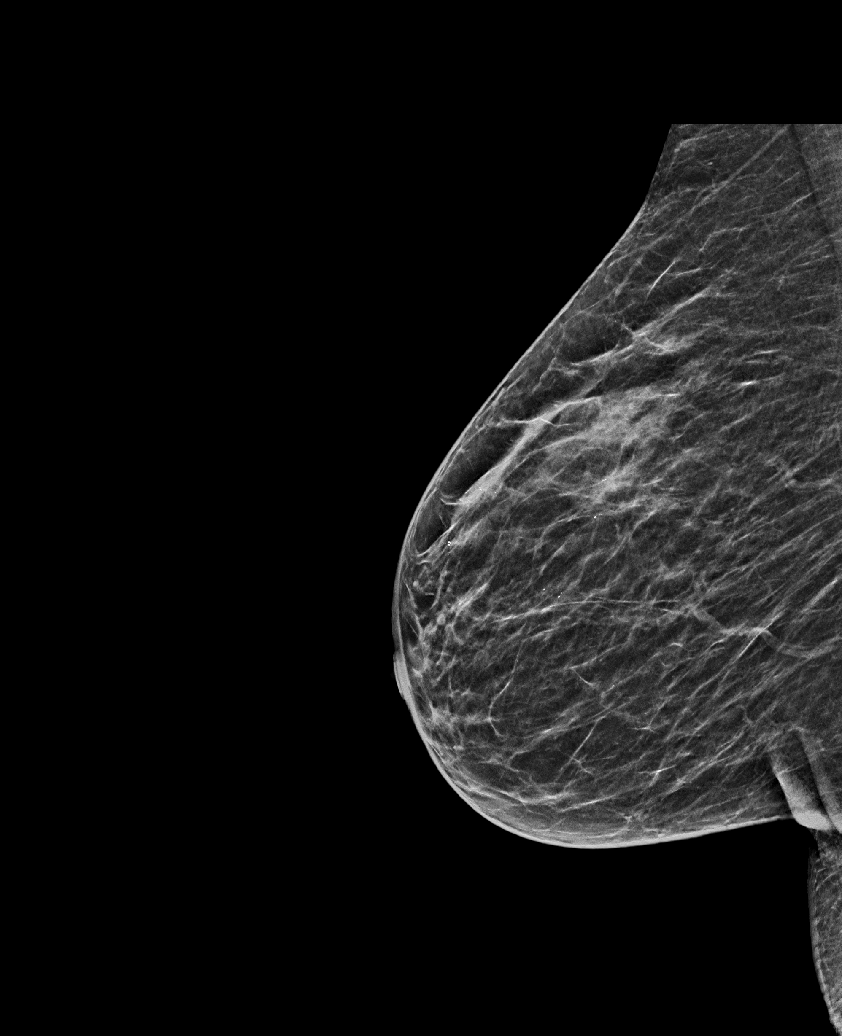

[L CC synth-2D]
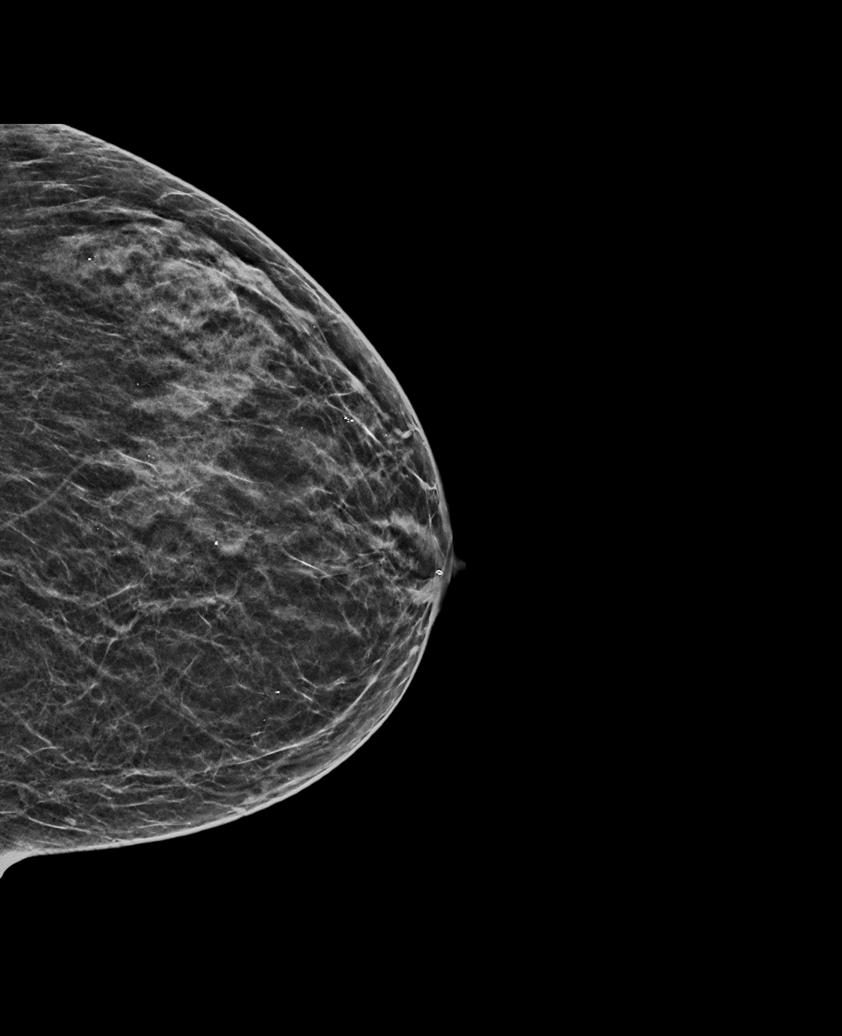

[R MLO tomo · tomo slice 22/43.0]
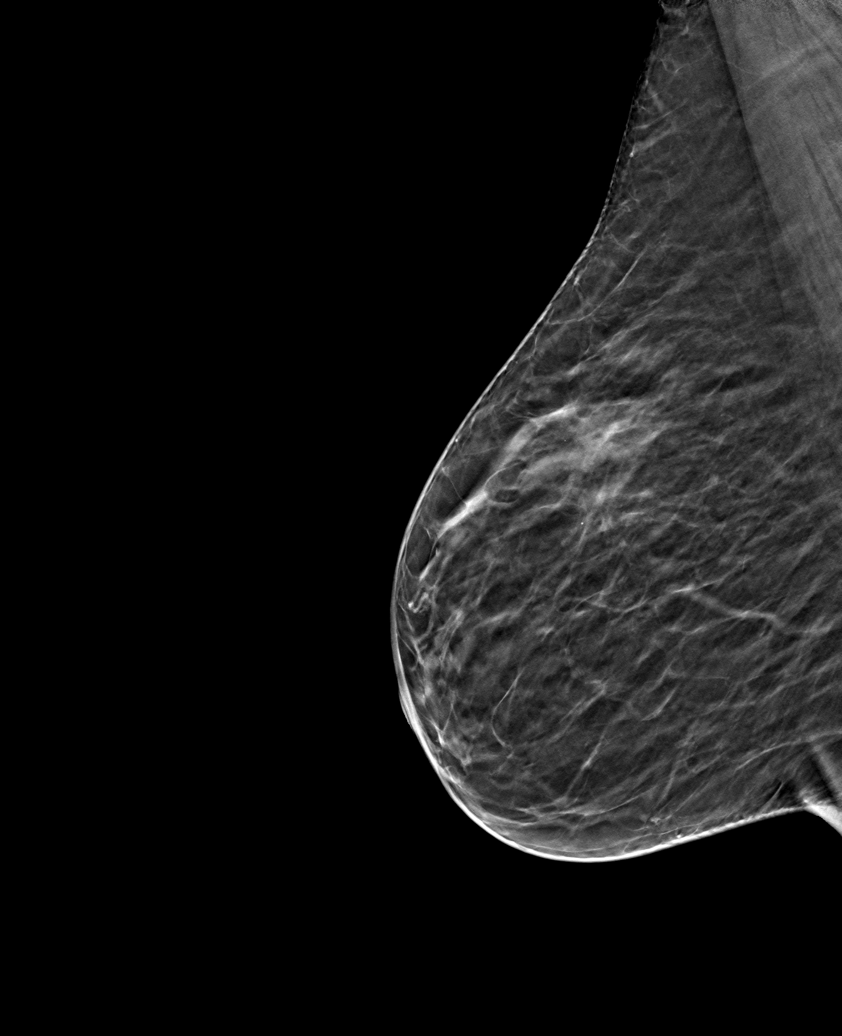

[6 of 30 positions shown; findings below may reference images not displayed]

ACR Breast Density Category b: There are scattered areas of
fibroglandular density.
FINDINGS: In both breasts, calcifications requires further evaluation.

No other significant abnormalities noted.
IMPRESSION: Further evaluation is suggested for possible calcifications within
both breasts.

RECOMMENDATION:
Diagnostic mammogram and possibly ultrasound of both breasts.
(Code:[id number]-[id number]-99D)

The patient will be contacted regarding the findings, and additional
imaging will be scheduled.

BI-RADS CATEGORY  0: Incomplete. Need additional imaging evaluation
and/or prior mammograms for comparison.

## 2024-04-16 ENCOUNTER — Other Ambulatory Visit: Payer: Self-pay | Admitting: Internal Medicine

## 2024-04-16 DIAGNOSIS — R921 Mammographic calcification found on diagnostic imaging of breast: Secondary | ICD-10-CM

## 2024-04-16 DIAGNOSIS — Z1231 Encounter for screening mammogram for malignant neoplasm of breast: Secondary | ICD-10-CM

## 2024-04-24 ENCOUNTER — Other Ambulatory Visit: Payer: Self-pay | Admitting: Medical Genetics

## 2024-05-01 ENCOUNTER — Other Ambulatory Visit
Admission: RE | Admit: 2024-05-01 | Discharge: 2024-05-01 | Disposition: A | Discharge status: Home / Self Care | Source: Ambulatory Visit | Attending: Medical Genetics | Admitting: Medical Genetics

## 2024-05-01 ENCOUNTER — Ambulatory Visit
Admission: RE | Admit: 2024-05-01 | Discharge: 2024-05-01 | Disposition: A | Discharge status: Home / Self Care | Source: Ambulatory Visit | Attending: Internal Medicine | Admitting: Internal Medicine

## 2024-05-01 DIAGNOSIS — R921 Mammographic calcification found on diagnostic imaging of breast: Secondary | ICD-10-CM | POA: Diagnosis present

## 2024-05-01 DIAGNOSIS — Z1231 Encounter for screening mammogram for malignant neoplasm of breast: Secondary | ICD-10-CM | POA: Insufficient documentation

## 2024-05-11 LAB — GENECONNECT MOLECULAR SCREEN: Genetic Analysis Overall Interpretation: NEGATIVE

## 2024-05-30 ENCOUNTER — Encounter: Payer: Self-pay | Admitting: Neurosurgery
